# Patient Record
Sex: Female | Born: 1955
Health system: Southern US, Community
[De-identification: ages and names within clinical notes are randomized; demographics above are authoritative.]

## PROBLEM LIST (undated history)

## (undated) DIAGNOSIS — E079 Disorder of thyroid, unspecified: Secondary | ICD-10-CM

## (undated) DIAGNOSIS — I10 Essential (primary) hypertension: Secondary | ICD-10-CM

## (undated) HISTORY — PX: CHOLECYSTECTOMY: SHX55

## (undated) HISTORY — PX: APPENDECTOMY: SHX54

## (undated) HISTORY — PX: BREAST BIOPSY: SHX20

## (undated) HISTORY — PX: ABDOMINAL HYSTERECTOMY: SHX81

---

## 2000-10-24 ENCOUNTER — Observation Stay (HOSPITAL_COMMUNITY): Admission: RE | Admit: 2000-10-24 | Discharge: 2000-10-25 | Payer: Self-pay | Admitting: Internal Medicine

## 2001-03-04 ENCOUNTER — Encounter: Payer: Self-pay | Admitting: Internal Medicine

## 2001-03-04 ENCOUNTER — Ambulatory Visit (HOSPITAL_COMMUNITY): Admission: RE | Admit: 2001-03-04 | Discharge: 2001-03-04 | Payer: Self-pay | Admitting: Internal Medicine

## 2002-03-04 ENCOUNTER — Ambulatory Visit (HOSPITAL_COMMUNITY): Admission: RE | Admit: 2002-03-04 | Discharge: 2002-03-04 | Payer: Self-pay | Admitting: Internal Medicine

## 2002-03-04 ENCOUNTER — Encounter: Payer: Self-pay | Admitting: Internal Medicine

## 2003-03-07 ENCOUNTER — Ambulatory Visit (HOSPITAL_COMMUNITY): Admission: RE | Admit: 2003-03-07 | Discharge: 2003-03-07 | Payer: Self-pay | Admitting: Internal Medicine

## 2003-03-07 ENCOUNTER — Encounter: Payer: Self-pay | Admitting: Internal Medicine

## 2004-03-26 ENCOUNTER — Ambulatory Visit (HOSPITAL_COMMUNITY): Admission: RE | Admit: 2004-03-26 | Discharge: 2004-03-26 | Payer: Self-pay | Admitting: Internal Medicine

## 2005-03-28 ENCOUNTER — Ambulatory Visit (HOSPITAL_COMMUNITY): Admission: RE | Admit: 2005-03-28 | Discharge: 2005-03-28 | Payer: Self-pay | Admitting: Internal Medicine

## 2006-04-21 ENCOUNTER — Ambulatory Visit (HOSPITAL_COMMUNITY): Admission: RE | Admit: 2006-04-21 | Discharge: 2006-04-21 | Payer: Self-pay | Admitting: Family Medicine

## 2008-07-15 HISTORY — PX: COLONOSCOPY: SHX174

## 2008-07-26 ENCOUNTER — Ambulatory Visit (HOSPITAL_COMMUNITY): Admission: RE | Admit: 2008-07-26 | Discharge: 2008-07-26 | Payer: Self-pay | Admitting: Internal Medicine

## 2008-07-26 ENCOUNTER — Ambulatory Visit: Payer: Self-pay | Admitting: Internal Medicine

## 2008-07-26 ENCOUNTER — Encounter: Payer: Self-pay | Admitting: Internal Medicine

## 2009-03-01 ENCOUNTER — Ambulatory Visit (HOSPITAL_COMMUNITY): Admission: RE | Admit: 2009-03-01 | Discharge: 2009-03-01 | Payer: Self-pay | Admitting: Family Medicine

## 2010-11-27 NOTE — Op Note (Signed)
[name], Wendy Estrada             ACCOUNT NO.:  [id number]   MEDICAL RECORD NO.:  [id number]          PATIENT TYPE:  AMB   LOCATION:  DAY                           FACILITY:  [facility]   PHYSICIAN:  R. Roetta Sessions, M.D. DATE OF BIRTH:  09/22/1955   DATE OF PROCEDURE:  07/26/2008  DATE OF DISCHARGE:                               OPERATIVE REPORT   PROCEDURE:  Ileocolonoscopy and snare polypectomy.   INDICATIONS FOR PROCEDURE:  A 55 year old lady sent over at the courtesy  of [facility] for colorectal cancer screening, via  colonoscopy.  She has never had a lower GI tract imaged.  There is no  family history of polyps or colon cancer, and she is devoid of any lower  GI tract symptoms.  Colonoscopy is now being done.  Risks, benefits,  alternatives, and limitations have been reviewed, questions answered.  Please see the documentation in the medical record.   PROCEDURE NOTE:  O2 saturation, blood pressure, pulse, and respirations  were monitored throughout the entire procedure.   CONSCIOUS SEDATION:  Versed 2 mg IV and Demerol 50 mg IV.   INSTRUMENT:  Pentax video chip system.   FINDINGS:  Digital rectal exam revealed no abnormalities.  Endoscopic  findings:  The prep was good.  Colon:  Colonic mucosa was surveyed from  the rectosigmoid junction through the left transverse, right colon, to  the appendiceal orifice, ileocecal valve, and cecum.  These structures  were well seen and photographed for the record.  Terminal ileum was  intubated at 5 cm.  From this level, the scope was slowly and cautiously  withdrawn.  All previously mentioned mucosal surfaces were again seen.  The colonic mucosa as well as terminal ileum mucosa appeared normal.  The scope was pulled down the rectum where thorough examination of the  rectal mucosa including retroflexed view of the anal verge demonstrated  a 4-mm polyp and it was 10 cm.  It was cold snared and recovered.  The  remainder of  the rectal mucosa appeared normal.  The patient tolerated  the procedure well and was reacted to Endoscopy.   IMPRESSION:  1. Rectal polyp status post cold snare removal, otherwise unremarkable      rectal mucosa.  2. Normal colon and terminal ileum.   RECOMMENDATIONS:  1. Followup on path.  2. Further recommendations to follow.      Jonathon Bellows, M.D.  Electronically Signed     RMR/MEDQ  D:  07/26/2008  T:  07/26/2008  Job:  [id number]   cc:   Patrica Duel, M.D.  Fax: [phone]

## 2011-01-17 ENCOUNTER — Other Ambulatory Visit (HOSPITAL_COMMUNITY): Payer: Self-pay | Admitting: Family Medicine

## 2011-01-17 DIAGNOSIS — Z139 Encounter for screening, unspecified: Secondary | ICD-10-CM

## 2011-01-18 ENCOUNTER — Ambulatory Visit (HOSPITAL_COMMUNITY)
Admission: RE | Admit: 2011-01-18 | Discharge: 2011-01-18 | Disposition: A | Discharge status: Home / Self Care | Payer: PRIVATE HEALTH INSURANCE | Source: Ambulatory Visit | Attending: Family Medicine | Admitting: Family Medicine

## 2011-01-18 DIAGNOSIS — Z139 Encounter for screening, unspecified: Secondary | ICD-10-CM

## 2011-01-18 DIAGNOSIS — Z1231 Encounter for screening mammogram for malignant neoplasm of breast: Secondary | ICD-10-CM | POA: Insufficient documentation

## 2013-03-13 ENCOUNTER — Emergency Department (HOSPITAL_COMMUNITY): Payer: PRIVATE HEALTH INSURANCE

## 2013-03-13 ENCOUNTER — Encounter (HOSPITAL_COMMUNITY): Payer: Self-pay | Admitting: *Deleted

## 2013-03-13 ENCOUNTER — Emergency Department (HOSPITAL_COMMUNITY)
Admission: EM | Admit: 2013-03-13 | Discharge: 2013-03-13 | Disposition: A | Discharge status: Home / Self Care | Payer: PRIVATE HEALTH INSURANCE | Attending: Emergency Medicine | Admitting: Emergency Medicine

## 2013-03-13 DIAGNOSIS — R0602 Shortness of breath: Secondary | ICD-10-CM | POA: Insufficient documentation

## 2013-03-13 DIAGNOSIS — I10 Essential (primary) hypertension: Secondary | ICD-10-CM | POA: Insufficient documentation

## 2013-03-13 DIAGNOSIS — M2550 Pain in unspecified joint: Secondary | ICD-10-CM | POA: Insufficient documentation

## 2013-03-13 DIAGNOSIS — E876 Hypokalemia: Secondary | ICD-10-CM | POA: Insufficient documentation

## 2013-03-13 DIAGNOSIS — R079 Chest pain, unspecified: Secondary | ICD-10-CM

## 2013-03-13 DIAGNOSIS — R072 Precordial pain: Secondary | ICD-10-CM | POA: Insufficient documentation

## 2013-03-13 DIAGNOSIS — Z87891 Personal history of nicotine dependence: Secondary | ICD-10-CM | POA: Insufficient documentation

## 2013-03-13 DIAGNOSIS — Z8639 Personal history of other endocrine, nutritional and metabolic disease: Secondary | ICD-10-CM | POA: Insufficient documentation

## 2013-03-13 DIAGNOSIS — M2540 Effusion, unspecified joint: Secondary | ICD-10-CM | POA: Insufficient documentation

## 2013-03-13 DIAGNOSIS — Z862 Personal history of diseases of the blood and blood-forming organs and certain disorders involving the immune mechanism: Secondary | ICD-10-CM | POA: Insufficient documentation

## 2013-03-13 HISTORY — DX: Disorder of thyroid, unspecified: E07.9

## 2013-03-13 HISTORY — DX: Essential (primary) hypertension: I10

## 2013-03-13 LAB — CBC WITH DIFFERENTIAL/PLATELET
Basophils Absolute: 0 10*3/uL (ref 0.0–0.1)
Basophils Relative: 1 % (ref 0–1)
Eosinophils Absolute: 0.1 10*3/uL (ref 0.0–0.7)
Eosinophils Relative: 2 % (ref 0–5)
HCT: 39.8 % (ref 36.0–46.0)
Hemoglobin: 13.5 g/dL (ref 12.0–15.0)
Lymphocytes Relative: 51 % — ABNORMAL HIGH (ref 12–46)
Lymphs Abs: 1.7 10*3/uL (ref 0.7–4.0)
MCH: 32.1 pg (ref 26.0–34.0)
MCHC: 33.9 g/dL (ref 30.0–36.0)
MCV: 94.5 fL (ref 78.0–100.0)
Monocytes Absolute: 0.3 10*3/uL (ref 0.1–1.0)
Monocytes Relative: 8 % (ref 3–12)
Neutro Abs: 1.3 10*3/uL — ABNORMAL LOW (ref 1.7–7.7)
Neutrophils Relative %: 39 % — ABNORMAL LOW (ref 43–77)
Platelets: 218 10*3/uL (ref 150–400)
RBC: 4.21 MIL/uL (ref 3.87–5.11)
RDW: 13.5 % (ref 11.5–15.5)
WBC: 3.4 10*3/uL — ABNORMAL LOW (ref 4.0–10.5)

## 2013-03-13 LAB — BASIC METABOLIC PANEL
BUN: 15 mg/dL (ref 6–23)
CO2: 33 mEq/L — ABNORMAL HIGH (ref 19–32)
Calcium: 9.8 mg/dL (ref 8.4–10.5)
Chloride: 99 mEq/L (ref 96–112)
Creatinine, Ser: 0.73 mg/dL (ref 0.50–1.10)
GFR calc Af Amer: >90 mL/min (ref >90)
GFR calc non Af Amer: >90 mL/min (ref >90)
Glucose, Bld: 104 mg/dL — ABNORMAL HIGH (ref 70–99)
Potassium: 3.1 mEq/L — ABNORMAL LOW (ref 3.5–5.1)
Sodium: 139 mEq/L (ref 135–145)

## 2013-03-13 LAB — TROPONIN I
Troponin I: <0.3 ng/mL (ref <0.30)
Troponin I: <0.3 ng/mL (ref <0.30)

## 2013-03-13 LAB — D-DIMER, QUANTITATIVE: D-Dimer, Quant: 1.9 ug/mL-FEU — ABNORMAL HIGH (ref 0.00–0.48)

## 2013-03-13 MED ORDER — POTASSIUM CHLORIDE CRYS ER 20 MEQ PO TBCR
40.0000 meq | EXTENDED_RELEASE_TABLET | Freq: Once | ORAL | Status: AC
  Administered 2013-03-13: 40 meq via ORAL
  Filled 2013-03-13: qty 2

## 2013-03-13 MED ORDER — POTASSIUM CHLORIDE ER 20 MEQ PO TBCR: 20.0000 meq | EXTENDED_RELEASE_TABLET | Freq: Two times a day (BID) | ORAL | Status: DC

## 2013-03-13 MED ORDER — IOHEXOL 350 MG/ML SOLN
100.0000 mL | Freq: Once | INTRAVENOUS | Status: AC | PRN
  Administered 2013-03-13: 100 mL via INTRAVENOUS

## 2013-03-13 NOTE — ED Provider Notes (Signed)
CSN: [id number]     Arrival date & time 03/13/13  4098 History   First MD Initiated Contact with Patient 03/13/13 [phone]     Chief Complaint  Patient presents with  . Chest Pain   (Consider location/radiation/quality/duration/timing/severity/associated sxs/prior Treatment) HPI Comments: Wendy Estrada is a 57 y.o. female who presents to the Emergency Department complaining of upper chest pressure and heaviness for 2 weeks.  States the pain began after standing near her son who was mowing the lawn and the grass clippings were blown in her face.  She describes a "heaviness" to her chest that has improved since onset, but states it has not resolved.  Also states she had one episode of shortness of breath, but denies any at this time.  She states that she was seen by her PMD this morning and was sent to ED for further evaluation.  She also denies nausea, fever, vomiting, abdominal pain, dizziness or headache.    Patient is a 57 y.o. female presenting with chest pain. The history is provided by the patient.  Chest Pain Pain location:  Substernal area Pain quality: pressure and tightness   Pain radiates to:  Does not radiate Pain radiates to the back: no   Pain severity:  Mild Onset quality:  Sudden Duration:  2 weeks Timing:  Constant Progression:  Partially resolved Chronicity:  New Context: not breathing, no drug use, not eating, not lifting, no movement, not raising an arm, not at rest, no stress and no trauma   Relieved by:  Nothing Worsened by:  Nothing tried Ineffective treatments:  Certain positions (OTC zyrtec) Associated symptoms: shortness of breath   Associated symptoms: no abdominal pain, no altered mental status, no anxiety, no back pain, no cough, no dizziness, no dysphagia, no fatigue, no fever, no headache, no nausea, no near-syncope, no numbness, no orthopnea, no palpitations, no syncope, not vomiting and no weakness   Risk factors: hypertension and obesity   Risk factors:  no aortic disease, no birth control, no coronary artery disease, no diabetes mellitus, no immobilization and no prior DVT/PE   Risk factors comment:  Former smoker   Past Medical History  Diagnosis Date  . Hypertension   . Thyroid disease     hypothyroidism   Past Surgical History  Procedure Laterality Date  . Cholecystectomy    . Appendectomy    . Abdominal hysterectomy     History reviewed. No pertinent family history. History  Substance Use Topics  . Smoking status: Former Games developer  . Smokeless tobacco: Not on file  . Alcohol Use: No   OB History   Grav Para Term Preterm Abortions TAB SAB Ect Mult Living                 Review of Systems  Constitutional: Negative for fever, chills, activity change, appetite change and fatigue.  HENT: Negative for trouble swallowing, neck pain and neck stiffness.   Respiratory: Positive for chest tightness and shortness of breath. Negative for apnea, cough, choking, wheezing and stridor.   Cardiovascular: Positive for chest pain. Negative for palpitations, orthopnea, leg swelling, syncope and near-syncope.  Gastrointestinal: Negative for nausea, vomiting and abdominal pain.  Genitourinary: Negative for dysuria, flank pain and difficulty urinating.  Musculoskeletal: Positive for joint swelling and arthralgias. Negative for back pain.  Skin: Negative for color change, rash and wound.  Neurological: Negative for dizziness, syncope, speech difficulty, weakness, numbness and headaches.  Hematological: Negative for adenopathy. Does not bruise/bleed easily.  All other systems  reviewed and are negative.    Allergies  Review of patient's allergies indicates not on file.  Home Medications  No current outpatient prescriptions on file. BP 151/84  Pulse 62  Temp(Src) 98.1 F (36.7 C) (Oral)  Resp 17  Ht 5\' 5"  (1.651 m)  Wt 151 lb (68.493 kg)  BMI 25.13 kg/m2  SpO2 100% Physical Exam  Nursing note and vitals reviewed. Constitutional: She  is oriented to person, place, and time. She appears well-developed and well-nourished. No distress.  HENT:  Head: Normocephalic and atraumatic.  Mouth/Throat: Oropharynx is clear and moist.  Neck: Normal range of motion. Neck supple.  Cardiovascular: Normal rate, regular rhythm, normal heart sounds and intact distal pulses.   No murmur heard. Pulmonary/Chest: Effort normal and breath sounds normal. No respiratory distress. She exhibits no tenderness.  Abdominal: Soft. She exhibits no distension. There is no tenderness. There is no rebound and no guarding.  Musculoskeletal: Normal range of motion. She exhibits no edema.  Lymphadenopathy:    She has no cervical adenopathy.  Neurological: She is alert and oriented to person, place, and time. She exhibits normal muscle tone. Coordination normal.  Skin: Skin is warm and dry.    ED Course  Procedures (including critical care time)   Labs Review  Results for orders placed during the hospital encounter of 03/13/13  CBC WITH DIFFERENTIAL      Result Value Range   WBC 3.4 (*) 4.0 - 10.5 K/uL   RBC 4.21  3.87 - 5.11 MIL/uL   Hemoglobin 13.5  12.0 - 15.0 g/dL   HCT 16.1  09.6 - 04.5 %   MCV 94.5  78.0 - 100.0 fL   MCH 32.1  26.0 - 34.0 pg   MCHC 33.9  30.0 - 36.0 g/dL   RDW 40.9  81.1 - 91.4 %   Platelets 218  150 - 400 K/uL   Neutrophils Relative % 39 (*) 43 - 77 %   Neutro Abs 1.3 (*) 1.7 - 7.7 K/uL   Lymphocytes Relative 51 (*) 12 - 46 %   Lymphs Abs 1.7  0.7 - 4.0 K/uL   Monocytes Relative 8  3 - 12 %   Monocytes Absolute 0.3  0.1 - 1.0 K/uL   Eosinophils Relative 2  0 - 5 %   Eosinophils Absolute 0.1  0.0 - 0.7 K/uL   Basophils Relative 1  0 - 1 %   Basophils Absolute 0.0  0.0 - 0.1 K/uL  BASIC METABOLIC PANEL      Result Value Range   Sodium 139  135 - 145 mEq/L   Potassium 3.1 (*) 3.5 - 5.1 mEq/L   Chloride 99  96 - 112 mEq/L   CO2 33 (*) 19 - 32 mEq/L   Glucose, Bld 104 (*) 70 - 99 mg/dL   BUN 15  6 - 23 mg/dL    Creatinine, Ser 7.82  0.50 - 1.10 mg/dL   Calcium 9.8  8.4 - 95.6 mg/dL   GFR calc non Af Amer >90  >90 mL/min   GFR calc Af Amer >90  >90 mL/min  TROPONIN I      Result Value Range   Troponin I <0.30  <0.30 ng/mL  D-DIMER, QUANTITATIVE      Result Value Range   D-Dimer, Quant 1.90 (*) 0.00 - 0.48 ug/mL-FEU  TROPONIN I      Result Value Range   Troponin I <0.30  <0.30 ng/mL     Imaging Review  Dg Chest  2 View  03/13/2013   *RADIOLOGY REPORT*  Clinical Data: Chest pain  CHEST - 2 VIEW  Comparison: None.  Findings: The heart pulmonary vascularity are within normal limits. The lungs are clear bilaterally.  No acute bony abnormality is seen.  IMPRESSION: No acute abnormality noted.   Original Report Authenticated By: Alcide Clever, M.D.   Ct Angio Chest W/cm &/or Wo Cm  03/13/2013   *RADIOLOGY REPORT*  Clinical Data: Chest pain  CT ANGIOGRAPHY CHEST  Technique:  Multidetector CT imaging of the chest using the standard protocol during bolus administration of intravenous contrast. Multiplanar reconstructed images including MIPs were obtained and reviewed to evaluate the vascular anatomy.  Contrast: OMNIPAQUE IOHEXOL 350 MG/ML SOLN  Comparison: None.  Findings: The lungs are clear bilaterally.  No focal infiltrate or sizable effusion is seen.  The hilar and mediastinal structures are within normal limits.  No lymphadenopathy is seen.  The thoracic aorta is unremarkable.  The pulmonary artery is well visualized and shows no filling defect to suggest pulmonary emboli.  The visualized upper abdomen is within normal limits.  No bony abnormality is identified.  IMPRESSION: No evidence of pulmonary emboli.  No acute abnormality is noted.   Original Report Authenticated By: Alcide Clever, M.D.    MDM     Date: 03/13/2013  Rate: 60  Rhythm: normal sinus rhythm  QRS Axis: normal  Intervals: normal  ST/T Wave abnormalities: normal  Conduction Disutrbances:none  Narrative Interpretation:   Old EKG  Reviewed: none available  EKG reviewed by Dr. Clelia Schaumann  Advised that patient's PMD, Dr. Sherwood Gambler, sent pt here for cardiac work-up and he will then arrange pt to have stress test if cleared for discharge.  1120  Patient is resting comfortably.  Denies pain or dyspnea.  Discussed x-ray and lab results.  D-dimer elevated, Will order CT angio.  Pt hx and care plan discussed with the EDP, Dr. Estell Harpin who agrees to care plan  Labs and imaging results discussed with the patient, continues to deny pain or shortness of breath.  Mild hypokalemia that is likely related to her HCTZ use, will give potassium here and prescribe short course.  Patient agrees to f/u with her PMD to have potassium rechecked, VSS.  Appears stable for discharge.     Paola Flynt L. Niambi Smoak, PA-C 03/15/13 0930

## 2013-03-13 NOTE — ED Notes (Signed)
Pt c/o chest heaviness and intermittent dizziness x2-3 weeks. Pt had one episode of SOB since symptoms began. Pt denies NVD. Pt also reports intermittent headache, "scratchy throat" and nasal congestion.

## 2013-03-13 NOTE — ED Notes (Addendum)
Patient states chest heaviness began about 2-3 weeks ago after having grass shavings blown in her face.  It has not worsened over period of time.  Had period of SOB initially which has resolved. No fevers, cough, or nausea.  Slight throat soreness intermittently.  Saw Dr. Sherwood Gambler today who wanted her scheduled for a stress test.

## 2013-03-15 NOTE — ED Provider Notes (Signed)
Medical screening examination/treatment/procedure(s) were performed by non-physician practitioner and as supervising physician I was immediately available for consultation/collaboration.   Sampson Self L Carolin Quang, MD 03/15/13 2156 

## 2013-03-22 ENCOUNTER — Other Ambulatory Visit (HOSPITAL_COMMUNITY): Payer: Self-pay | Admitting: Internal Medicine

## 2013-03-22 DIAGNOSIS — Z139 Encounter for screening, unspecified: Secondary | ICD-10-CM

## 2013-03-23 ENCOUNTER — Ambulatory Visit (HOSPITAL_COMMUNITY)
Admission: RE | Admit: 2013-03-23 | Discharge: 2013-03-23 | Disposition: A | Discharge status: Home / Self Care | Payer: PRIVATE HEALTH INSURANCE | Source: Ambulatory Visit | Attending: Internal Medicine | Admitting: Internal Medicine

## 2013-03-23 DIAGNOSIS — Z1231 Encounter for screening mammogram for malignant neoplasm of breast: Secondary | ICD-10-CM | POA: Insufficient documentation

## 2013-03-23 DIAGNOSIS — Z139 Encounter for screening, unspecified: Secondary | ICD-10-CM

## 2013-03-25 ENCOUNTER — Ambulatory Visit (HOSPITAL_COMMUNITY): Payer: PRIVATE HEALTH INSURANCE

## 2013-03-26 ENCOUNTER — Other Ambulatory Visit: Payer: Self-pay | Admitting: Internal Medicine

## 2013-03-26 DIAGNOSIS — R928 Other abnormal and inconclusive findings on diagnostic imaging of breast: Secondary | ICD-10-CM

## 2013-03-31 ENCOUNTER — Ambulatory Visit (HOSPITAL_COMMUNITY)
Admission: RE | Admit: 2013-03-31 | Discharge: 2013-03-31 | Disposition: A | Discharge status: Home / Self Care | Payer: PRIVATE HEALTH INSURANCE | Source: Ambulatory Visit | Attending: Internal Medicine | Admitting: Internal Medicine

## 2013-03-31 ENCOUNTER — Other Ambulatory Visit: Payer: Self-pay | Admitting: Internal Medicine

## 2013-03-31 DIAGNOSIS — R928 Other abnormal and inconclusive findings on diagnostic imaging of breast: Secondary | ICD-10-CM

## 2013-04-01 ENCOUNTER — Encounter: Payer: Self-pay | Admitting: Cardiology

## 2013-04-01 ENCOUNTER — Ambulatory Visit (INDEPENDENT_AMBULATORY_CARE_PROVIDER_SITE_OTHER): Payer: PRIVATE HEALTH INSURANCE | Admitting: Cardiology

## 2013-04-01 VITALS — BP 152/80 | HR 54 | Ht 65.0 in | Wt 153.0 lb

## 2013-04-01 DIAGNOSIS — Z720 Tobacco use: Secondary | ICD-10-CM | POA: Insufficient documentation

## 2013-04-01 DIAGNOSIS — E785 Hyperlipidemia, unspecified: Secondary | ICD-10-CM

## 2013-04-01 DIAGNOSIS — F17200 Nicotine dependence, unspecified, uncomplicated: Secondary | ICD-10-CM

## 2013-04-01 DIAGNOSIS — I10 Essential (primary) hypertension: Secondary | ICD-10-CM | POA: Insufficient documentation

## 2013-04-01 DIAGNOSIS — R079 Chest pain, unspecified: Secondary | ICD-10-CM | POA: Insufficient documentation

## 2013-04-01 MED ORDER — PANTOPRAZOLE SODIUM 20 MG PO TBEC: 20.0000 mg | DELAYED_RELEASE_TABLET | Freq: Every day | ORAL | Status: DC

## 2013-04-01 NOTE — Patient Instructions (Addendum)
Your physician recommends that you schedule a follow-up appointment in: 3 WEEKS  Your physician has requested that you have an exercise tolerance test. For further information please visit [email or web address] Please also follow instruction sheet, as given.  Your physician has requested that you have an echocardiogram. Echocardiography is a painless test that uses sound waves to create images of your heart. It provides your doctor with information about the size and shape of your heart and how well your heart's chambers and valves are working. This procedure takes approximately one hour. There are no restrictions for this procedure.  Your physician has recommended you make the following change in your medication:   1) START TAKING PANTOPROZOLE 20MG  ONCE DAILY  Exercise Stress Electrocardiography An exercise stress test is a heart test (EKG) which is done while you are moving. You will walk on a treadmill. This test will tell your doctor how your heart does when it is forced to work harder and how much activity you can safely handle. BEFORE THE TEST  Wear shorts or athletic pants.  Wear comfortable tennis shoes.  Women need to wear a bra that allows patches to be put on under it. TEST  An EKG cable will be attached to your waist. This cable is hooked up to patches, which look like round stickers stuck to your chest.  You will be asked to walk on the treadmill.  You will walk until you are too tired or until you are told to stop.  Tell the doctor right away if you have:  Chest pain.  Leg cramps.  Shortness of breath.  Dizziness.  The test may last 30 minutes to 1 hour. The timing depends on your physical condition and the condition of your heart. AFTER THE TEST  You will rest for about 6 minutes. During this time, your heart rhythm and blood pressure will be checked.  The testing equipment will be removed from your body and you can get dressed.  You may go home or back to  your hospital room. You may keep doing all your usual activities as told by your doctor. Finding out the results of your test Ask when your test results will be ready. Make sure you get your test results. Document Released: 12/18/2007 Document Revised: 09/23/2011 Document Reviewed: 12/18/2007 [facility] Patient Information 2014 [place], [place].

## 2013-04-01 NOTE — Progress Notes (Signed)
   Clinical Summary Ms. Latella is a 57 y.o.female  1. Chest pain:  - started approx 1 month ago. Pressure like pain in mid chest, 3/10. Has belching with it with improvement of symptoms. No SOB, no palpitations, no diaphoresis, occas nausea, no vomiting. Often comes on w/ hunger, often better w/ eating. Sometimes comes on with emotional stress or exertion. Typically symptoms last just a few minutes. Not positional.  - highest level of exertion is pushing lawn mower for 2.5 hrs straight - no orthopnea, no PND, no LE edema - CAD risk factrors: HTN, HL, former tobacco, father MI in 38s.  -seen in ER 03/13/13 - +D-dimer, negatve CT PE, Trop negative.   2. HTN - checks at home occasionally. Last PCP visit 120/78.  3. MV:HQIONGEX by PCP, compliant w/ statin  Past Medical History  Diagnosis Date  . Hypertension   . Thyroid disease     hypothyroidism     Allergies  Allergen Reactions  . Codeine     nausea     Current Outpatient Prescriptions  Medication Sig Dispense Refill  . cetirizine (ZYRTEC) 10 MG tablet Take 10 mg by mouth daily.      . hydrochlorothiazide (HYDRODIURIL) 25 MG tablet Take 25 mg by mouth daily.      Marland Kitchen levothyroxine (SYNTHROID, LEVOTHROID) 25 MCG tablet Take 25 mcg by mouth daily before breakfast.      . potassium chloride 20 MEQ TBCR Take 20 mEq by mouth 2 (two) times daily.  30 tablet  0  . simvastatin (ZOCOR) 20 MG tablet Take 20 mg by mouth every evening.       No current facility-administered medications for this visit.     Past Surgical History  Procedure Laterality Date  . Cholecystectomy    . Appendectomy    . Abdominal hysterectomy       Allergies  Allergen Reactions  . Codeine     nausea      No family history on file.   Social History Ms. Storr reports that she has quit smoking. She does not have any smokeless tobacco history on file. Ms. Korte reports that she does not drink alcohol.   Review of Systems 12 point ROS  negative other than reported in HPI  Physical Examination p 54 bp 152/80 Wt 153 BMI 25  Gen: resting comfortably, NAD HEENT: no scleral icterus, pupils equal round and reactive, no palptable cervical adenopathy CV Pulm: CTAB Abd: soft, NT, ND NABS, no hepatosplenomegaly Ext: warm, no edema.  Skin: warm, no rash Neuro: A&Ox3, no focal deficits    Diagnostic Studies 03/13/13 CTA: no PE, no acute abnormality  03/13/13: SR, rate 65, normal axis, PR 160, QRS .80 LAE, no ischemic changes  Assessment and Plan  1. Chest pain: story mainly atypical for cardiac chest pain, most consistent likely w/ GI etiology. Some components are worrisome given that there is some association w/ exertion, and she has several CAD risk factors - will obtain exercise stress test - obtain 2D echocardiogram - start PPI emperically  2. HTN: elevated here, at PCP visit was at goal - continued management per PCP  3. HL: no recent panel available -continued management per PCP  Antoine Poche, M.D., F.A.C.C.

## 2013-04-02 ENCOUNTER — Encounter: Payer: Self-pay | Admitting: Cardiology

## 2013-04-08 ENCOUNTER — Encounter (HOSPITAL_COMMUNITY): Payer: PRIVATE HEALTH INSURANCE

## 2013-04-09 ENCOUNTER — Encounter (HOSPITAL_COMMUNITY): Payer: Self-pay

## 2013-04-09 ENCOUNTER — Encounter (HOSPITAL_COMMUNITY): Payer: PRIVATE HEALTH INSURANCE

## 2013-04-09 ENCOUNTER — Ambulatory Visit (HOSPITAL_COMMUNITY)
Admission: RE | Admit: 2013-04-09 | Discharge: 2013-04-09 | Disposition: A | Discharge status: Home / Self Care | Payer: PRIVATE HEALTH INSURANCE | Source: Ambulatory Visit | Attending: Internal Medicine | Admitting: Internal Medicine

## 2013-04-09 DIAGNOSIS — R079 Chest pain, unspecified: Secondary | ICD-10-CM

## 2013-04-09 DIAGNOSIS — Z87891 Personal history of nicotine dependence: Secondary | ICD-10-CM | POA: Insufficient documentation

## 2013-04-09 DIAGNOSIS — I10 Essential (primary) hypertension: Secondary | ICD-10-CM | POA: Insufficient documentation

## 2013-04-09 DIAGNOSIS — I059 Rheumatic mitral valve disease, unspecified: Secondary | ICD-10-CM

## 2013-04-09 NOTE — Progress Notes (Signed)
*  PRELIMINARY RESULTS* Echocardiogram 2D Echocardiogram has been performed.  Wendy Estrada 04/09/2013, 1:03 PM

## 2013-04-09 NOTE — Progress Notes (Signed)
Stress Lab Nurses Notes - [name]  Wendy Estrada 04/09/2013 Reason for doing test: Chest Pain Type of test: Regular GTX Nurse performing test: Parke Poisson, RN Nuclear Medicine Tech: Not Applicable Echo Tech: Not Applicable MD performing test: Dr. Purvis Sheffield / Joni Reining NP Family MD: Dr. Sherwood Gambler Test explained and consent signed: yes IV started: No IV started Symptoms: None Treatment/Intervention: None Reason test stopped: reached target HR After recovery IV was: NA Patient to return to Nuc. Med at : NA Patient discharged: Home Patient's Condition upon discharge was: stable Comments: During test peak BP 196/68 & HR 179.  Recovery BP 138/88 & HR 74.  Symptoms resolved in recovery. [name]

## 2013-04-23 ENCOUNTER — Ambulatory Visit (INDEPENDENT_AMBULATORY_CARE_PROVIDER_SITE_OTHER): Payer: PRIVATE HEALTH INSURANCE | Admitting: Cardiology

## 2013-04-23 ENCOUNTER — Encounter: Payer: Self-pay | Admitting: Cardiology

## 2013-04-23 VITALS — BP 142/78 | HR 58 | Ht 65.0 in | Wt 156.0 lb

## 2013-04-23 DIAGNOSIS — R079 Chest pain, unspecified: Secondary | ICD-10-CM

## 2013-04-23 NOTE — Progress Notes (Signed)
Clinical Summary Ms. Gaster is a 57 y.o.female 1. Chest pain:  - started approx 1 month ago. Pressure like pain in mid chest, 3/10. Has belching with it with improvement of symptoms. No SOB, no palpitations, no diaphoresis, occas nausea, no vomiting. Often comes on w/ hunger, often better w/ eating. Sometimes comes on with emotional stress or exertion. Typically symptoms last just a few minutes. Not positional.  - highest level of exertion is pushing lawn mower for 2.5 hrs straight  - no orthopnea, no PND, no LE edema  - CAD risk factrors: HTN, HL, former tobacco, father MI in 73s.  -seen in ER 03/13/13  - +D-dimer, negatve CT PE, Trop negative.   - started PPI emperically last visit, symptoms have now resolved.     Past Medical History  Diagnosis Date  . Hypertension   . Thyroid disease     hypothyroidism     Allergies  Allergen Reactions  . Codeine     nausea     Current Outpatient Prescriptions  Medication Sig Dispense Refill  . cetirizine (ZYRTEC) 10 MG tablet Take 10 mg by mouth daily.      . hydrochlorothiazide (HYDRODIURIL) 25 MG tablet Take 25 mg by mouth daily.      Marland Kitchen levothyroxine (SYNTHROID, LEVOTHROID) 25 MCG tablet Take 25 mcg by mouth daily before breakfast.      . pantoprazole (PROTONIX) 20 MG tablet Take 1 tablet (20 mg total) by mouth daily.  90 tablet  1  . potassium chloride 20 MEQ TBCR Take 20 mEq by mouth 2 (two) times daily.  30 tablet  0  . simvastatin (ZOCOR) 20 MG tablet Take 20 mg by mouth every evening.       No current facility-administered medications for this visit.     Past Surgical History  Procedure Laterality Date  . Cholecystectomy    . Appendectomy    . Abdominal hysterectomy       Allergies  Allergen Reactions  . Codeine     nausea      No family history on file.   Social History Ms. Derk reports that she has quit smoking. She does not have any smokeless tobacco history on file. Ms. Villasenor reports that  she does not drink alcohol.   Review of Systems CONSTITUTIONAL: No weight loss, fever, chills, weakness or fatigue.  HEENT: Eyes: No visual loss, blurred vision, double vision or yellow sclerae.No hearing loss, sneezing, congestion, runny nose or sore throat.  SKIN: No rash or itching.  CARDIOVASCULAR: per HPI RESPIRATORY: No shortness of breath, cough or sputum.  GASTROINTESTINAL: No anorexia, nausea, vomiting or diarrhea. No abdominal pain or blood.  GENITOURINARY: No burning on urination, no polyuria NEUROLOGICAL: No headache, dizziness, syncope, paralysis, ataxia, numbness or tingling in the extremities. No change in bowel or bladder control.  MUSCULOSKELETAL: No muscle, back pain, joint pain or stiffness.  LYMPHATICS: No enlarged nodes. No history of splenectomy.  PSYCHIATRIC: No history of depression or anxiety.  ENDOCRINOLOGIC: No reports of sweating, cold or heat intolerance. No polyuria or polydipsia.  Marland Kitchen   Physical Examination p 58 bp 142/78 Wt 156 lbs BMI 26 Gen: resting comfortably, no acute distress HEENT: no scleral icterus, pupils equal round and reactive, no palptable cervical adenopathy,  CV: RRR, no m/r/g, no JVD Resp: Clear to auscultation bilaterally GI: abdomen is soft, non-tender, non-distended, normal bowel sounds, no hepatosplenomegaly MSK: extremities are warm, no edema.  Skin: warm, no rash Neuro:  no focal  deficits Psych: appropriate affect   Diagnostic Studies 04/09/13 Echo: LVEF 60-65%, normal diastolic function,   Exc stress test 04/09/13:   8 minutes, METs 10.40, TH 107%, above average functional capacity, no chest pain. Non-specific ST/T changes inferior leads, less than 1 mm ST depression lateral precordial leads during recovery. Duke Treadmill score 8, low risk  Assessment and Plan   1. Chest pain: story mainly atypical for cardiac chest pain, most consistent likely w/ GI etiology. Some components are worrisome given that there is some  association w/ exertion, and she has several CAD risk factors  - exercise stress test low risk, showed good functional capacity, low risk Duke score with non-specific ST/T changes overall not specific for ischemia - symptoms have resolved on PPI - obtain 2D echocardiogram  - start PPI emperically  - will discharge from clinic, feel free to reconsult if neccesary in the future.        Antoine Poche, M.D., F.A.C.C.

## 2013-04-23 NOTE — Patient Instructions (Addendum)
Your physician recommends that you schedule a follow-up appointment in: AS NEEDED  

## 2013-05-14 ENCOUNTER — Encounter: Payer: Self-pay | Admitting: Cardiovascular Disease

## 2013-05-14 NOTE — Progress Notes (Signed)
Stress Lab Nurses Notes - [name]  Wendy Estrada  04/09/2013  Reason for doing test: Chest Pain  Type of test: Regular GTX  Nurse performing test: Parke Poisson, RN  Nuclear Medicine Tech: Not Applicable  Echo Tech: Not Applicable  MD performing test: Dr. Purvis Sheffield / Joni Reining NP  Family MD: Dr. Sherwood Gambler  Test explained and consent signed: yes  IV started: No IV started  Symptoms: None  Treatment/Intervention: None  Reason test stopped: reached target HR  After recovery IV was: NA  Patient to return to Nuc. Med at : NA  Patient discharged: Home  Patient's Condition upon discharge was: stable  Comments: During test peak BP 196/68 & HR 179. Recovery BP 138/88 & HR 74. Symptoms resolved in recovery.  [name]   Attending Note: The patient was stressed according to the Bruce protocol for 8:00, achieving a work level of 10.4 METS. The resting HR of 57 bpm rose to a maximal HR of 179 bpm, representing 107% of the maximal, age-predicted HR. The resting BP of 152/82 mmHg rose to a maximal BP of 202/82 mmHg. The test was stopped due to fatigue.  The resting ECG showed normal sinus rhythm, 62 bpm. With exercise, there were <1 mm horizontal ST segment depressions, which are non-diagnostic. There were no arrhythmias seen.  He had good exercise tolerance. He was noted to have resting stage 1 hypertension with a hypertensive response to exercise. His Duke treadmill score is 5.5, placing him at a low risk for future cardiovascular events.

## 2014-01-17 ENCOUNTER — Telehealth: Payer: Self-pay | Admitting: Cardiology

## 2014-01-17 NOTE — Telephone Encounter (Signed)
Received fax refill request ° °Rx # [id number] °Medication:  Pantoprazole 20 mg tablets °Qty 90 °Sig:  Take one tablet by mouth every day °Physician:  Branch  ° ° °

## 2014-01-17 NOTE — Telephone Encounter (Signed)
Pt is prn only now to cardiology since 04/2013 Will refer to pcp to refill

## 2014-01-18 ENCOUNTER — Telehealth: Payer: Self-pay | Admitting: Cardiology

## 2014-01-18 MED ORDER — PANTOPRAZOLE SODIUM 20 MG PO TBEC: 20.0000 mg | DELAYED_RELEASE_TABLET | Freq: Every day | ORAL | Status: DC

## 2014-01-18 NOTE — Telephone Encounter (Signed)
Refill request complete 

## 2014-01-18 NOTE — Telephone Encounter (Signed)
Received fax refill request  Rx # [id number] Medication:  Pantoprazole 20 mg tablets Qty 90 Sig:  Take one tablet by mouth every day Physician:  Wyline MoodBranch

## 2014-12-29 ENCOUNTER — Ambulatory Visit (INDEPENDENT_AMBULATORY_CARE_PROVIDER_SITE_OTHER): Payer: PRIVATE HEALTH INSURANCE | Admitting: Orthopedic Surgery

## 2014-12-29 VITALS — BP 158/92 | Ht 65.0 in | Wt 183.0 lb

## 2014-12-29 DIAGNOSIS — M722 Plantar fascial fibromatosis: Secondary | ICD-10-CM | POA: Diagnosis not present

## 2014-12-29 NOTE — Patient Instructions (Addendum)
Plantar Fasciitis (Heel Spur Syndrome) with Rehab The plantar fascia is a fibrous, ligament-like, soft-tissue structure that spans the bottom of the foot. Plantar fasciitis is a condition that causes pain in the foot due to inflammation of the tissue. SYMPTOMS   Pain and tenderness on the underneath side of the foot.  Pain that worsens with standing or walking. CAUSES  Plantar fasciitis is caused by irritation and injury to the plantar fascia on the underneath side of the foot. Common mechanisms of injury include:  Direct trauma to bottom of the foot.  Damage to a small nerve that runs under the foot where the main fascia attaches to the heel bone.  Stress placed on the plantar fascia due to bone spurs. RISK INCREASES WITH:   Activities that place stress on the plantar fascia (running, jumping, pivoting, or cutting).  Poor strength and flexibility.  Improperly fitted shoes.  Tight calf muscles.  Flat feet.  Failure to warm-up properly before activity.  Obesity. PREVENTION  Warm up and stretch properly before activity.  Allow for adequate recovery between workouts.  Maintain physical fitness:  Strength, flexibility, and endurance.  Cardiovascular fitness.  Maintain a health body weight.  Avoid stress on the plantar fascia.  Wear properly fitted shoes, including arch supports for individuals who have flat feet. PROGNOSIS  If treated properly, then the symptoms of plantar fasciitis usually resolve without surgery. However, occasionally surgery is necessary. RELATED COMPLICATIONS   Recurrent symptoms that may result in a chronic condition.  Problems of the lower back that are caused by compensating for the injury, such as limping.  Pain or weakness of the foot during push-off following surgery.  Chronic inflammation, scarring, and partial or complete fascia tear, occurring more often from repeated injections. TREATMENT  Treatment initially involves the use of  ice and medication to help reduce pain and inflammation. The use of strengthening and stretching exercises may help reduce pain with activity, especially stretches of the Achilles tendon. These exercises may be performed at home or with a therapist. Your caregiver may recommend that you use heel cups of arch supports to help reduce stress on the plantar fascia. Occasionally, corticosteroid injections are given to reduce inflammation. If symptoms persist for greater than 6 months despite non-surgical (conservative), then surgery may be recommended.  MEDICATION   If pain medication is necessary, then nonsteroidal anti-inflammatory medications, such as aspirin and ibuprofen, or other minor pain relievers, such as acetaminophen, are often recommended.  Do not take pain medication within 7 days before surgery.  Prescription pain relievers may be given if deemed necessary by your caregiver. Use only as directed and only as much as you need.  Corticosteroid injections may be given by your caregiver. These injections should be reserved for the most serious cases, because they may only be given a certain number of times. HEAT AND COLD  Cold treatment (icing) relieves pain and reduces inflammation. Cold treatment should be applied for 10 to 15 minutes every 2 to 3 hours for inflammation and pain and immediately after any activity that aggravates your symptoms. Use ice packs or massage the area with a piece of ice (ice massage).  Heat treatment may be used prior to performing the stretching and strengthening activities prescribed by your caregiver, physical therapist, or athletic trainer. Use a heat pack or soak the injury in warm water. SEEK IMMEDIATE MEDICAL CARE IF:  Treatment seems to offer no benefit, or the condition worsens.  Any medications produce adverse side effects. EXERCISES do  3 sets of 10 hold for 3-5 seconds RANGE OF MOTION (ROM) AND STRETCHING EXERCISES - Plantar Fasciitis (Heel Spur  Syndrome) These exercises may help you when beginning to rehabilitate your injury. Your symptoms may resolve with or without further involvement from your physician, physical therapist or athletic trainer. While completing these exercises, remember:   Restoring tissue flexibility helps normal motion to return to the joints. This allows healthier, less painful movement and activity.  An effective stretch should be held for at least 30 seconds.  A stretch should never be painful. You should only feel a gentle lengthening or release in the stretched tissue. RANGE OF MOTION - Toe Extension, Flexion  Sit with your right / left leg crossed over your opposite knee.  Grasp your toes and gently pull them back toward the top of your foot. You should feel a stretch on the bottom of your toes and/or foot.  Hold this stretch for __________ seconds.  Now, gently pull your toes toward the bottom of your foot. You should feel a stretch on the top of your toes and or foot.  Hold this stretch for __________ seconds. Repeat __________ times. Complete this stretch __________ times per day.  RANGE OF MOTION - Ankle Dorsiflexion, Active Assisted  Remove shoes and sit on a chair that is preferably not on a carpeted surface.  Place right / left foot under knee. Extend your opposite leg for support.  Keeping your heel down, slide your right / left foot back toward the chair until you feel a stretch at your ankle or calf. If you do not feel a stretch, slide your bottom forward to the edge of the chair, while still keeping your heel down.  Hold this stretch for __________ seconds. Repeat __________ times. Complete this stretch __________ times per day.  STRETCH - Gastroc, Standing  Place hands on wall.  Extend right / left leg, keeping the front knee somewhat bent.  Slightly point your toes inward on your back foot.  Keeping your right / left heel on the floor and your knee straight, shift your weight  toward the wall, not allowing your back to arch.  You should feel a gentle stretch in the right / left calf. Hold this position for __________ seconds. Repeat __________ times. Complete this stretch __________ times per day. STRETCH - Soleus, Standing  Place hands on wall.  Extend right / left leg, keeping the other knee somewhat bent.  Slightly point your toes inward on your back foot.  Keep your right / left heel on the floor, bend your back knee, and slightly shift your weight over the back leg so that you feel a gentle stretch deep in your back calf.  Hold this position for __________ seconds. Repeat __________ times. Complete this stretch __________ times per day. STRETCH - Gastrocsoleus, Standing  Note: This exercise can place a lot of stress on your foot and ankle. Please complete this exercise only if specifically instructed by your caregiver.   Place the ball of your right / left foot on a step, keeping your other foot firmly on the same step.  Hold on to the wall or a rail for balance.  Slowly lift your other foot, allowing your body weight to press your heel down over the edge of the step.  You should feel a stretch in your right / left calf.  Hold this position for __________ seconds.  Repeat this exercise with a slight bend in your right / left knee. Repeat  __________ times. Complete this stretch __________ times per day.  STRENGTHENING EXERCISES - Plantar Fasciitis (Heel Spur Syndrome)  These exercises may help you when beginning to rehabilitate your injury. They may resolve your symptoms with or without further involvement from your physician, physical therapist or athletic trainer. While completing these exercises, remember:   Muscles can gain both the endurance and the strength needed for everyday activities through controlled exercises.  Complete these exercises as instructed by your physician, physical therapist or athletic trainer. Progress the resistance and  repetitions only as guided. STRENGTH - Towel Curls  Sit in a chair positioned on a non-carpeted surface.  Place your foot on a towel, keeping your heel on the floor.  Pull the towel toward your heel by only curling your toes. Keep your heel on the floor.  If instructed by your physician, physical therapist or athletic trainer, add ____________________ at the end of the towel. Repeat __________ times. Complete this exercise __________ times per day. STRENGTH - Ankle Inversion  Secure one end of a rubber exercise band/tubing to a fixed object (table, pole). Loop the other end around your foot just before your toes.  Place your fists between your knees. This will focus your strengthening at your ankle.  Slowly, pull your big toe up and in, making sure the band/tubing is positioned to resist the entire motion.  Hold this position for __________ seconds.  Have your muscles resist the band/tubing as it slowly pulls your foot back to the starting position. Repeat __________ times. Complete this exercises __________ times per day.  Document Released: 07/01/2005 Document Revised: 09/23/2011 Document Reviewed: 10/13/2008 [facility] Patient Information 2015 [place], [place]. This information is not intended to replace advice given to you by your health care provider. Make sure you discuss any questions you have with your health care provider.

## 2014-12-29 NOTE — Progress Notes (Signed)
Patient ID: Wendy Estrada, female   DOB: Dec 15, 1955, 59 y.o.   MRN: [id number] New    Chief Complaint  Patient presents with  . Foot Pain    left heel pain     Wendy Estrada is a 59 y.o. female.   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 Symptoms pain bottom of the heel, quality sharp, timing constant but worse when she gets up in the morning and after sitting down and getting back up. Intensity of pain 7. Prior  x-ray shows a heel spur  Previous treatment she got an injection at urgent care and she took  Review of Systems See hpi  Past Medical History  Diagnosis Date  . Hypertension   . Thyroid disease     hypothyroidism    Past Surgical History  Procedure Laterality Date  . Cholecystectomy    . Appendectomy    . Abdominal hysterectomy      No family history on file.  Social History History  Substance Use Topics  . Smoking status: Former Games developer  . Smokeless tobacco: Not on file  . Alcohol Use: No    Allergies  Allergen Reactions  . Codeine     nausea    Current Outpatient Prescriptions  Medication Sig Dispense Refill  . cetirizine (ZYRTEC) 10 MG tablet Take 10 mg by mouth daily.    . hydrochlorothiazide (HYDRODIURIL) 25 MG tablet Take 25 mg by mouth daily.    Marland Kitchen levothyroxine (SYNTHROID, LEVOTHROID) 25 MCG tablet Take 25 mcg by mouth daily before breakfast.    . pantoprazole (PROTONIX) 20 MG tablet Take 1 tablet (20 mg total) by mouth daily. 90 tablet 1  . Potassium Chloride ER 20 MEQ TBCR Take 20 mEq by mouth daily.    . simvastatin (ZOCOR) 20 MG tablet Take 20 mg by mouth every evening.     No current facility-administered medications for this visit.       Physical Exam Blood pressure 158/92, height 5\' 5"  (1.651 m), weight 183 lb (83.008 kg). Physical Exam The patient is well developed well nourished and well groomed. Orientation to person place and time is normal  Mood is  pleasant. Ambulatory status  ambulatory status is without assistive devices and no noticeable limp Foot appears to be slightly flattened terms of her arch ankle range of motion appears to be normal Achilles tendon appears to be only slightly tight ankle stability normal with the drawer maneuver. Motor exam is normal no atrophy. Scans intact. Sensation is normal. Foot is warm to touch. No lymphadenopathy or angitis.   Data Rev x-ray reviewed independently interpreted as heel spur no arthritis  Assessment Encounter Diagnosis  Name Primary?  . Plantar fasciitis Yes   Plan  Night splint.  Prescription heel cup.  Ice.  Exercise.  Follow-up 6 weeks

## 2015-01-19 IMAGING — CT CT ANGIO CHEST
1 of 6 series · 5 of 36 positions shown · IV contrast (Omnipaque 300)
Comparison: None.

CLINICAL DATA: Chest pain

CT ANGIOGRAPHY CHEST
TECHNIQUE: Multidetector CT imaging of the chest using the
standard protocol during bolus administration of intravenous
contrast. Multiplanar reconstructed images including MIPs were
obtained and reviewed to evaluate the vascular anatomy.
Contrast: 100mL OMNIPAQUE IOHEXOL 350 MG/ML SOLN

[Series 4: pe 3.0 b40f · axial · 0.68mm/px · z∈[-227,-59]mm · 5 of 85 slices shown]
[im 15/85  lung]
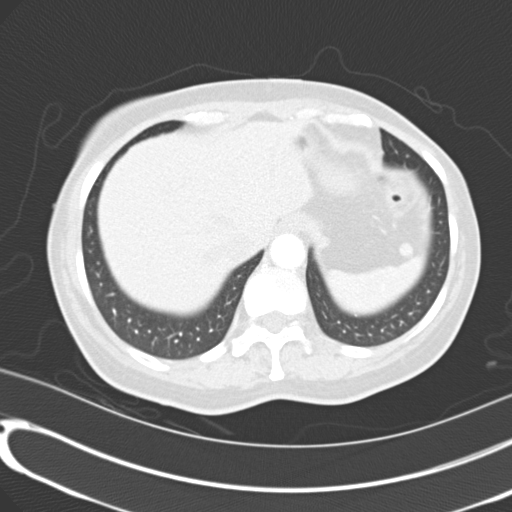
[im 29/85  mediastinal]
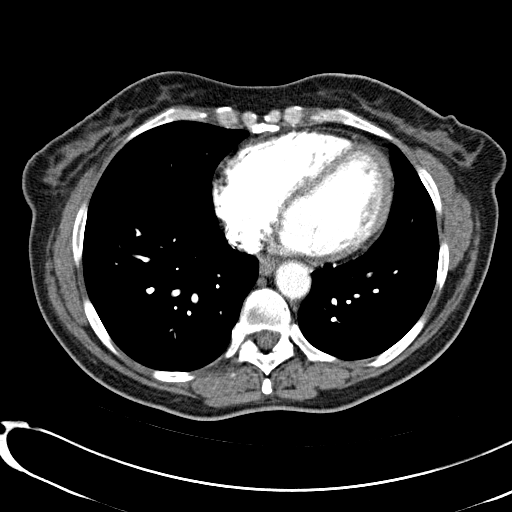
[im 43/85  lung]
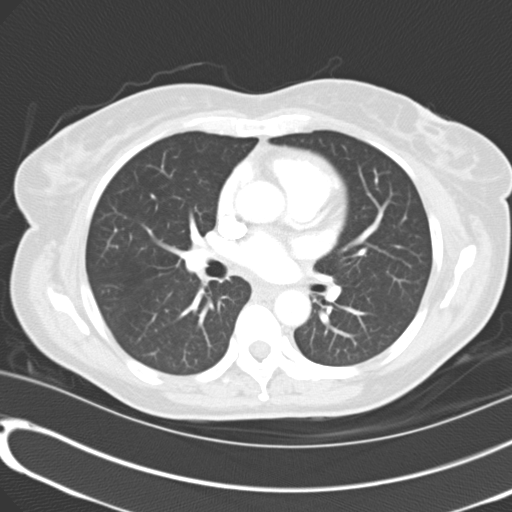
[im 57/85  mediastinal]
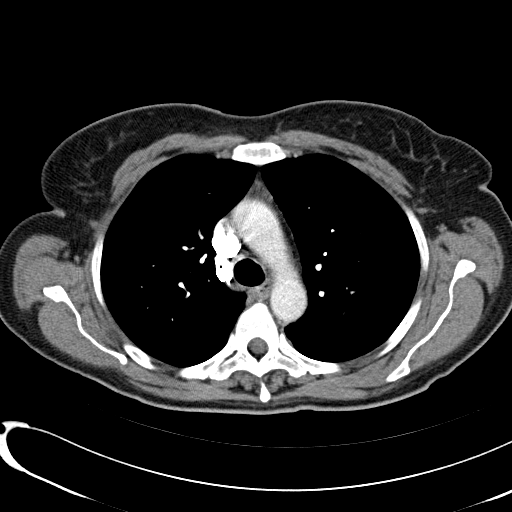
[im 71/85  lung]
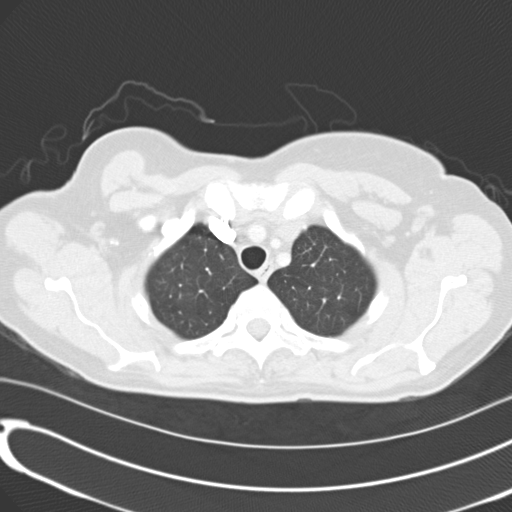

[5 of 36 positions shown; findings below may reference images not displayed]

FINDINGS: The lungs are clear bilaterally.  No focal infiltrate or
sizable effusion is seen.  The hilar and mediastinal structures are
within normal limits.  No lymphadenopathy is seen.  The thoracic
aorta is unremarkable.  The pulmonary artery is well visualized and
shows no filling defect to suggest pulmonary emboli.  The
visualized upper abdomen is within normal limits.  No bony
abnormality is identified.
IMPRESSION: No evidence of pulmonary emboli.  No acute abnormality is noted.

## 2015-01-19 IMAGING — CR DG CHEST 2V
2 series · 2 of 2 positions shown · non-contrast
Comparison: None.

CLINICAL DATA: Chest pain

CHEST - 2 VIEW

[view not recorded (1 of 2)]
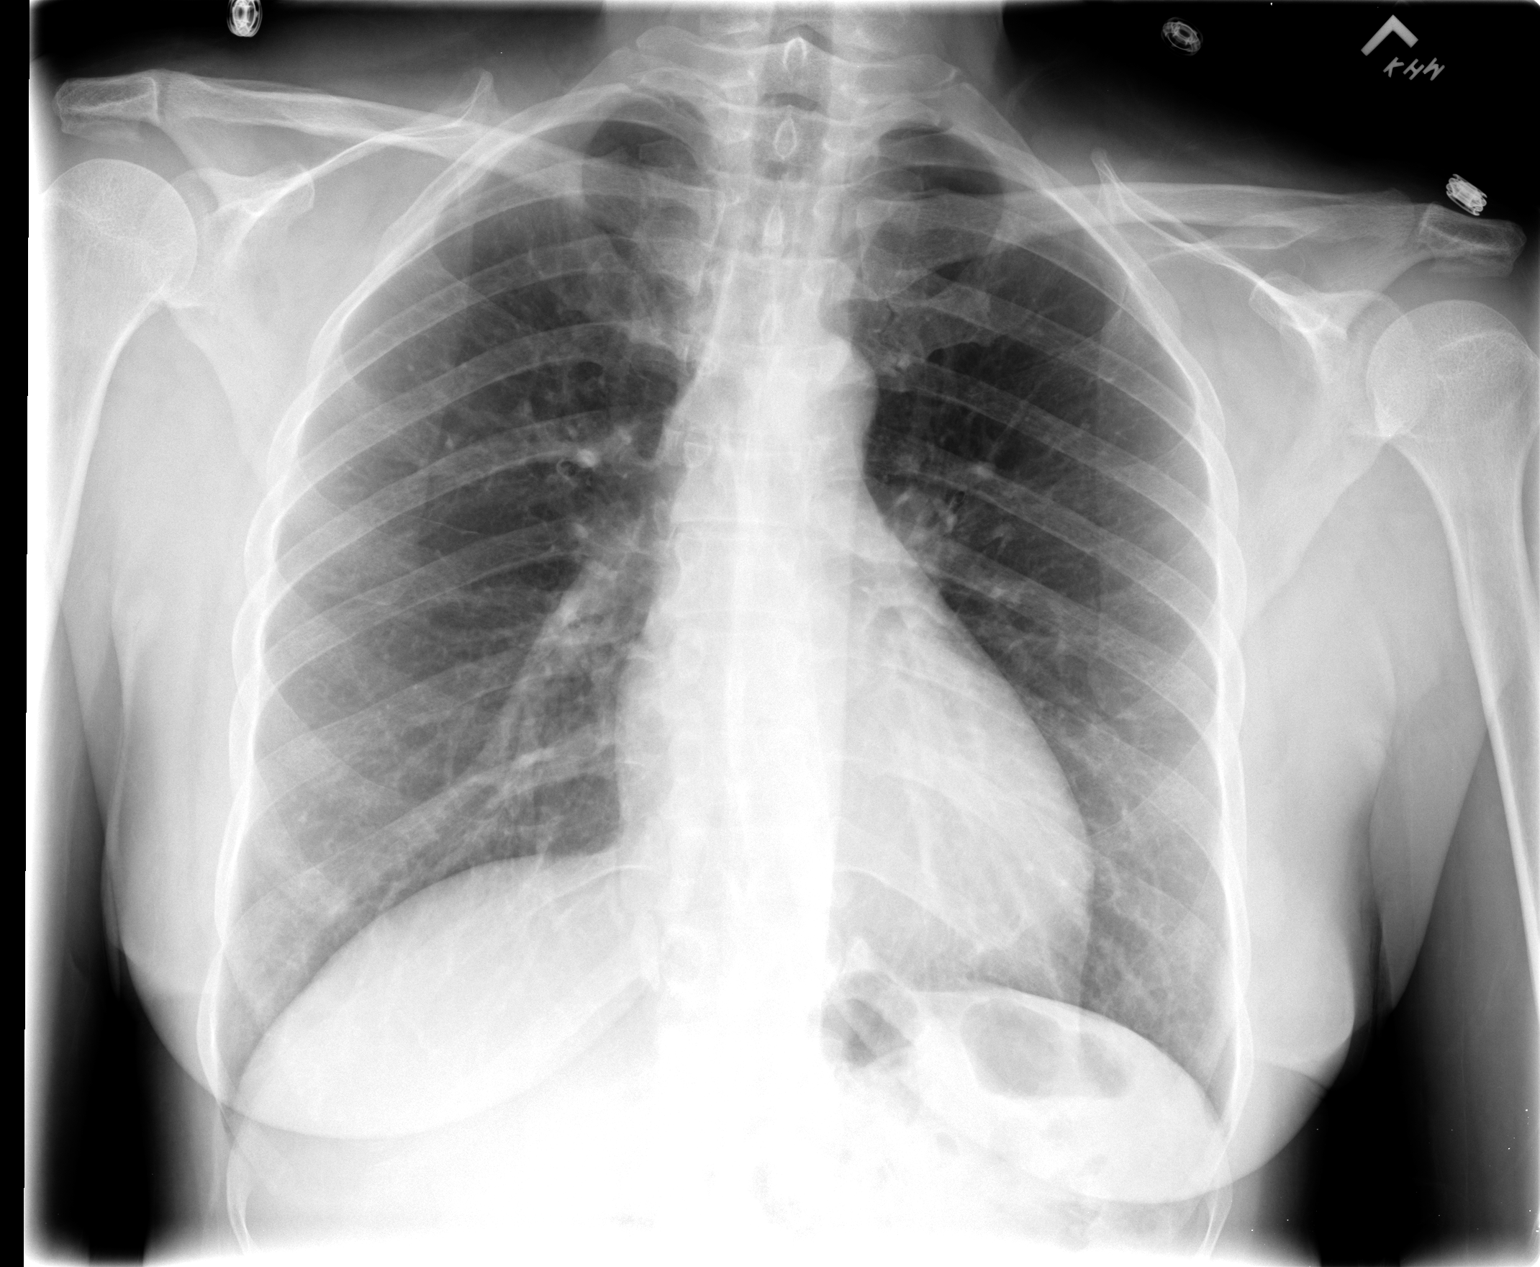

[view not recorded (2 of 2)]
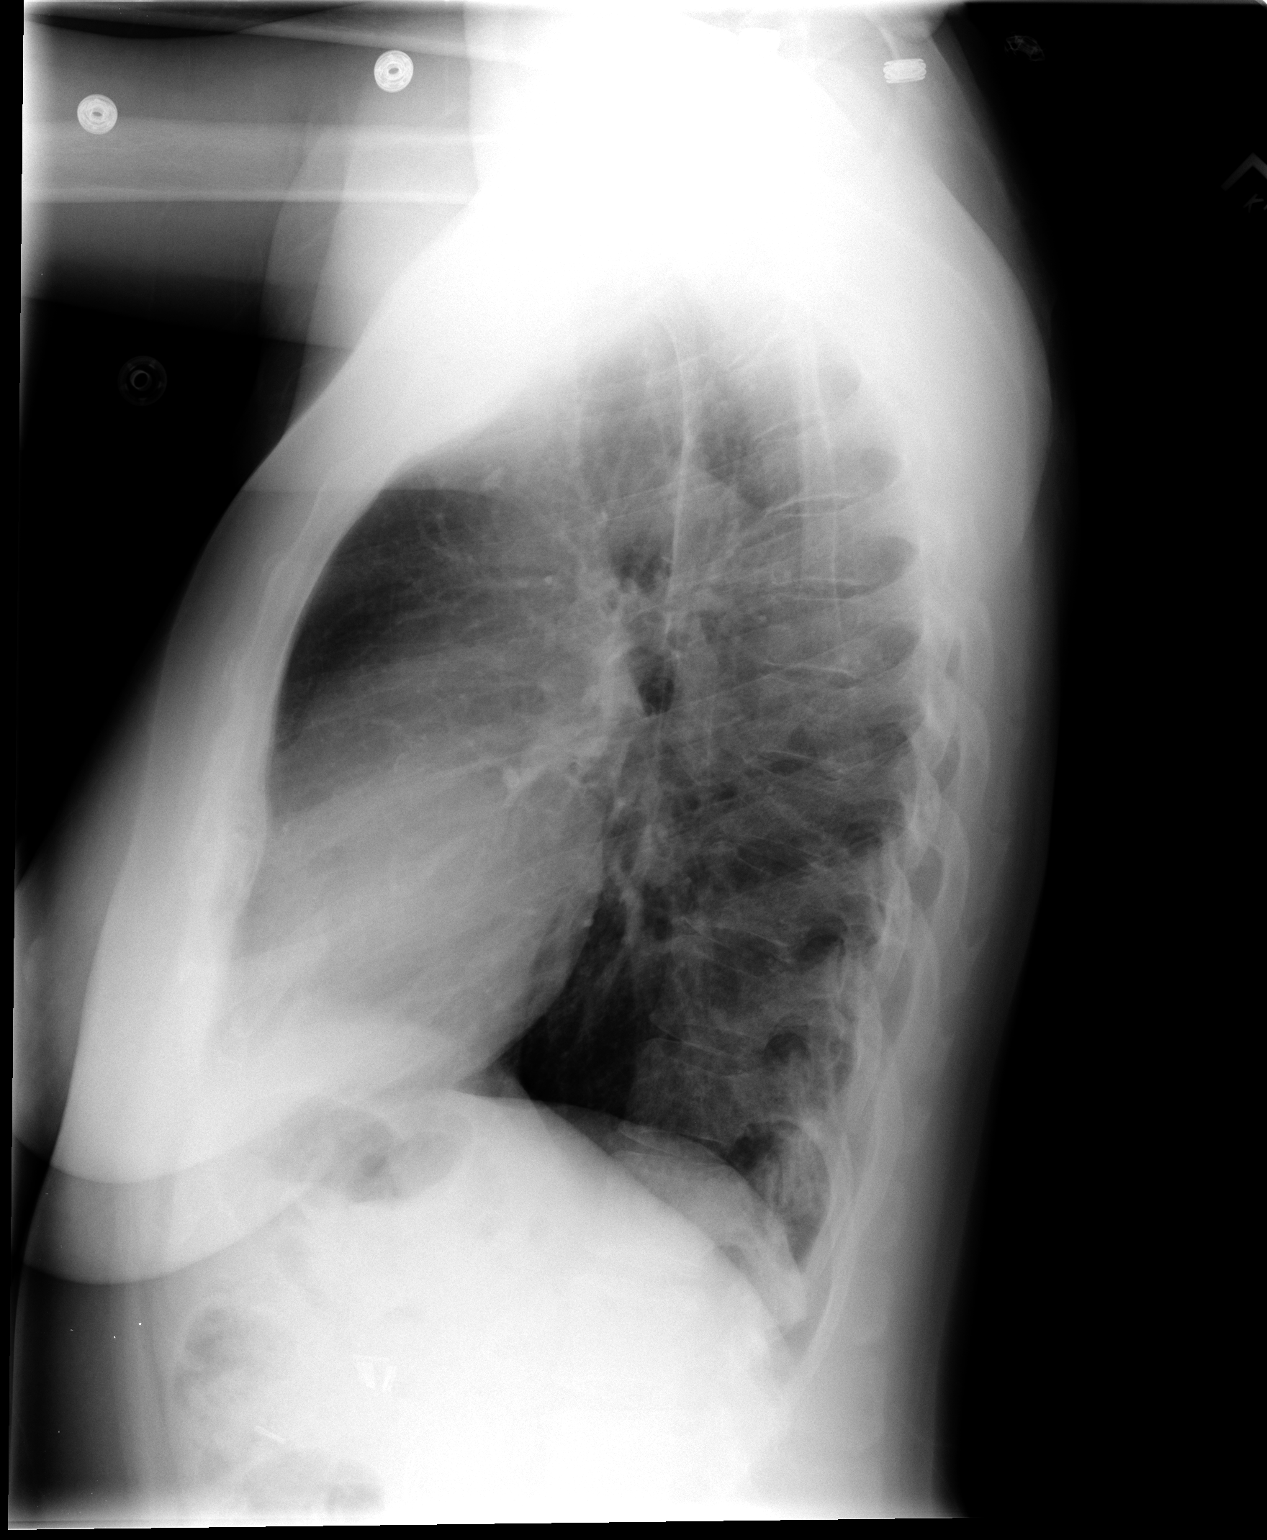

[2 of 2 positions shown; findings below may reference images not displayed]

FINDINGS: The heart pulmonary vascularity are within normal limits.
The lungs are clear bilaterally.  No acute bony abnormality is
seen.
IMPRESSION: No acute abnormality noted.

## 2015-02-20 ENCOUNTER — Ambulatory Visit (INDEPENDENT_AMBULATORY_CARE_PROVIDER_SITE_OTHER): Payer: PRIVATE HEALTH INSURANCE | Admitting: Orthopedic Surgery

## 2015-02-20 VITALS — BP 143/85 | Ht 65.0 in | Wt 183.0 lb

## 2015-02-20 DIAGNOSIS — M722 Plantar fascial fibromatosis: Secondary | ICD-10-CM | POA: Diagnosis not present

## 2015-02-20 NOTE — Progress Notes (Signed)
Patient ID: Wendy Estrada, female   DOB: 04-29-1956, 59 y.o.   MRN: [id number] Chief Complaint  Patient presents with  . Follow-up    6 week follow up left heel s/p home exercises    History 59 year old female treated for plantar fasciitis with home exercise program, heel cups, ice, anti-inflammatory. No improvement  Night splint also use did not help made her foot her.  She wore an ankle brace which helped she has pain on the medial side of the plantar aspect of her heel  Review of systems normal for symptoms of neurologic dysfunction  BP 143/85 mmHg  Ht  (1.651 m)  Wt 183 lb (83.008 kg)  BMI 30.45 kg/m2 We see mild flatness of the foot normal range of motion at the ankle tenderness over the medial plantar fascial band with a stable ankle joint, normal motor exam no sensory abnormalities and good pulses  Inject heel  Follow-up 6 weeks continue current exercise program.  She may bring the brace by for me to check to see why it's hurting her foot at night   Verbal consent was obtained timeout was used to confirm left heel as injection site A steroid injection was performed at medial plantar heel using 1% plain Lidocaine and 40 mg of depomedrol. This was well tolerated.

## 2015-04-03 ENCOUNTER — Encounter: Payer: Self-pay | Admitting: Orthopedic Surgery

## 2015-04-03 ENCOUNTER — Ambulatory Visit (INDEPENDENT_AMBULATORY_CARE_PROVIDER_SITE_OTHER): Payer: PRIVATE HEALTH INSURANCE | Admitting: Orthopedic Surgery

## 2015-04-03 VITALS — BP 153/85 | Ht 65.0 in | Wt 183.0 lb

## 2015-04-03 DIAGNOSIS — M722 Plantar fascial fibromatosis: Secondary | ICD-10-CM

## 2015-04-03 NOTE — Patient Instructions (Signed)
Injection today Home exercises Ice Boot when not working

## 2015-04-03 NOTE — Progress Notes (Signed)
Patient ID: ANAM BOBBY, female   DOB: 09-Mar-1956, 60 y.o.   MRN: [id number]  Follow up visit  Chief Complaint  Patient presents with  . Follow-up    follow up left heel s/p injection + brace    BP 153/85 mmHg  Ht  (1.651 m)  Wt 183 lb (83.008 kg)  BMI 30.45 kg/m2  Encounter Diagnosis  Name Primary?  . Plantar fasciitis Yes    The patient reports that the injection helped her a little bit.  Review of systems still having heel pain especially when she gets up especially walking 12 hours a day although it does resolve somewhat more ambulation until she sits again  No numbness or tingling  Tenderness at the plantar fascia  Physical exam unchanged from previous note dictated on  Recommend reinjection, Cam Walker when she's not working, exercises, ice, continue anti-inflammatory medication  Follow-up 6 weeks  Inject heel  Verbal consent was obtained patient consented to injection. Timeout completed to confirm injection site  A steroid injection was performed at left heel plantar fascia   using 1% plain Lidocaine and 40 mg Depo-Medrol. This was well tolerated.

## 2015-05-16 ENCOUNTER — Telehealth: Payer: Self-pay | Admitting: Orthopedic Surgery

## 2015-05-16 ENCOUNTER — Ambulatory Visit (INDEPENDENT_AMBULATORY_CARE_PROVIDER_SITE_OTHER): Payer: BLUE CROSS/BLUE SHIELD | Admitting: Orthopedic Surgery

## 2015-05-16 VITALS — BP 153/82 | Ht 65.0 in | Wt 183.0 lb

## 2015-05-16 DIAGNOSIS — M722 Plantar fascial fibromatosis: Secondary | ICD-10-CM | POA: Diagnosis not present

## 2015-05-16 NOTE — Patient Instructions (Addendum)
Continue wearing brace for 2 months and call us back in 2 months and let us know how you are doing.  Ice and exercise for the right foot.

## 2015-05-16 NOTE — Telephone Encounter (Signed)
No additional note

## 2015-05-17 ENCOUNTER — Encounter: Payer: Self-pay | Admitting: Orthopedic Surgery

## 2015-05-17 NOTE — Progress Notes (Signed)
Chief Complaint  Patient presents with  . Follow-up    6 week recheck on left heel after injection and bracing.    History this is a follow-up visit. The patient is being seen today for left plantar fasciitis status post injection which was repeated a second time on her last visit September 19. She reports decreased pain in her left heel improved walking distance and quality of walking. 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 She's been able to return exercise.  She comes in with a new complaint of aching mild pain right foot of several weeks duration.  Review of systems both feet have no neurologic symptoms at this time. There has been no rash and there has been no fever.  Past Medical History  Diagnosis Date  . Hypertension   . Thyroid disease     hypothyroidism    BP 153/82 mmHg  Ht 5\' 5"  (1.651 m)  Wt 183 lb (83.008 kg)  BMI 30.45 kg/m2 She is awake alert and oriented 3 mood and affect are normal her overall appearance is normal she is ambulatory with the boot on with a good heel-to-toe gait and on the left side we have no major tenderness full range of motion at the ankle joint, stable ankle motor exam intact on the left side we have mild tenderness in the plantar aspect of the heel full range of motion ankle stability intact and normal motor exam both feet exhibit normal skin sensation and vascularity with good pulses and capillary refill color and temperature  Impression left plantar fasciitis improved  Right plantar fasciitis new finding recommend ice and exercises  Patient will call in 2 months to discuss her progress. She will continue walking in her Cam Dan HumphreysWalker

## 2015-10-04 ENCOUNTER — Other Ambulatory Visit: Payer: Self-pay | Admitting: Internal Medicine

## 2015-10-04 DIAGNOSIS — Z1231 Encounter for screening mammogram for malignant neoplasm of breast: Secondary | ICD-10-CM

## 2015-10-17 ENCOUNTER — Other Ambulatory Visit (HOSPITAL_COMMUNITY): Payer: Self-pay | Admitting: Internal Medicine

## 2015-10-17 DIAGNOSIS — Z1231 Encounter for screening mammogram for malignant neoplasm of breast: Secondary | ICD-10-CM

## 2015-10-26 ENCOUNTER — Ambulatory Visit (HOSPITAL_COMMUNITY)
Admission: RE | Admit: 2015-10-26 | Discharge: 2015-10-26 | Disposition: A | Discharge status: Home / Self Care | Payer: BLUE CROSS/BLUE SHIELD | Source: Ambulatory Visit | Attending: Internal Medicine | Admitting: Internal Medicine

## 2015-10-26 DIAGNOSIS — Z1231 Encounter for screening mammogram for malignant neoplasm of breast: Secondary | ICD-10-CM | POA: Insufficient documentation

## 2016-01-04 DIAGNOSIS — Z6829 Body mass index (BMI) 29.0-29.9, adult: Secondary | ICD-10-CM | POA: Diagnosis not present

## 2016-01-04 DIAGNOSIS — I10 Essential (primary) hypertension: Secondary | ICD-10-CM | POA: Diagnosis not present

## 2016-01-04 DIAGNOSIS — E063 Autoimmune thyroiditis: Secondary | ICD-10-CM | POA: Diagnosis not present

## 2016-01-04 DIAGNOSIS — E782 Mixed hyperlipidemia: Secondary | ICD-10-CM | POA: Diagnosis not present

## 2016-01-04 DIAGNOSIS — Z1389 Encounter for screening for other disorder: Secondary | ICD-10-CM | POA: Diagnosis not present

## 2016-01-08 DIAGNOSIS — E063 Autoimmune thyroiditis: Secondary | ICD-10-CM | POA: Diagnosis not present

## 2016-01-08 DIAGNOSIS — I10 Essential (primary) hypertension: Secondary | ICD-10-CM | POA: Diagnosis not present

## 2016-01-08 DIAGNOSIS — Z1389 Encounter for screening for other disorder: Secondary | ICD-10-CM | POA: Diagnosis not present

## 2016-01-08 DIAGNOSIS — E782 Mixed hyperlipidemia: Secondary | ICD-10-CM | POA: Diagnosis not present

## 2016-07-25 DIAGNOSIS — Z23 Encounter for immunization: Secondary | ICD-10-CM | POA: Diagnosis not present

## 2016-09-03 DIAGNOSIS — Z6831 Body mass index (BMI) 31.0-31.9, adult: Secondary | ICD-10-CM | POA: Diagnosis not present

## 2016-09-03 DIAGNOSIS — K219 Gastro-esophageal reflux disease without esophagitis: Secondary | ICD-10-CM | POA: Diagnosis not present

## 2016-09-03 DIAGNOSIS — Z1389 Encounter for screening for other disorder: Secondary | ICD-10-CM | POA: Diagnosis not present

## 2016-09-03 DIAGNOSIS — J329 Chronic sinusitis, unspecified: Secondary | ICD-10-CM | POA: Diagnosis not present

## 2016-09-03 DIAGNOSIS — E063 Autoimmune thyroiditis: Secondary | ICD-10-CM | POA: Diagnosis not present

## 2016-09-03 DIAGNOSIS — Z0001 Encounter for general adult medical examination with abnormal findings: Secondary | ICD-10-CM | POA: Diagnosis not present

## 2016-09-06 DIAGNOSIS — E748 Other specified disorders of carbohydrate metabolism: Secondary | ICD-10-CM | POA: Diagnosis not present

## 2016-09-06 DIAGNOSIS — Z1389 Encounter for screening for other disorder: Secondary | ICD-10-CM | POA: Diagnosis not present

## 2016-11-29 DIAGNOSIS — H6123 Impacted cerumen, bilateral: Secondary | ICD-10-CM | POA: Diagnosis not present

## 2017-05-27 DIAGNOSIS — J329 Chronic sinusitis, unspecified: Secondary | ICD-10-CM | POA: Diagnosis not present

## 2017-05-27 DIAGNOSIS — J069 Acute upper respiratory infection, unspecified: Secondary | ICD-10-CM | POA: Diagnosis not present

## 2017-05-27 DIAGNOSIS — H6123 Impacted cerumen, bilateral: Secondary | ICD-10-CM | POA: Diagnosis not present

## 2017-09-09 DIAGNOSIS — E6609 Other obesity due to excess calories: Secondary | ICD-10-CM | POA: Diagnosis not present

## 2017-09-09 DIAGNOSIS — R7309 Other abnormal glucose: Secondary | ICD-10-CM | POA: Diagnosis not present

## 2017-09-09 DIAGNOSIS — Z23 Encounter for immunization: Secondary | ICD-10-CM | POA: Diagnosis not present

## 2017-09-09 DIAGNOSIS — Z1389 Encounter for screening for other disorder: Secondary | ICD-10-CM | POA: Diagnosis not present

## 2017-09-09 DIAGNOSIS — Z0000 Encounter for general adult medical examination without abnormal findings: Secondary | ICD-10-CM | POA: Diagnosis not present

## 2017-09-09 DIAGNOSIS — Z6830 Body mass index (BMI) 30.0-30.9, adult: Secondary | ICD-10-CM | POA: Diagnosis not present

## 2017-11-13 ENCOUNTER — Other Ambulatory Visit (HOSPITAL_COMMUNITY): Payer: Self-pay | Admitting: Internal Medicine

## 2017-11-13 DIAGNOSIS — Z1231 Encounter for screening mammogram for malignant neoplasm of breast: Secondary | ICD-10-CM

## 2017-11-15 ENCOUNTER — Other Ambulatory Visit: Payer: Self-pay

## 2017-11-15 ENCOUNTER — Encounter (HOSPITAL_COMMUNITY): Payer: Self-pay

## 2017-11-15 ENCOUNTER — Emergency Department (HOSPITAL_COMMUNITY)
Admission: EM | Admit: 2017-11-15 | Discharge: 2017-11-15 | Disposition: A | Discharge status: Home / Self Care | Payer: BLUE CROSS/BLUE SHIELD | Attending: Emergency Medicine | Admitting: Emergency Medicine

## 2017-11-15 DIAGNOSIS — Z87891 Personal history of nicotine dependence: Secondary | ICD-10-CM | POA: Diagnosis not present

## 2017-11-15 DIAGNOSIS — K529 Noninfective gastroenteritis and colitis, unspecified: Secondary | ICD-10-CM | POA: Insufficient documentation

## 2017-11-15 DIAGNOSIS — Z79899 Other long term (current) drug therapy: Secondary | ICD-10-CM | POA: Insufficient documentation

## 2017-11-15 DIAGNOSIS — E860 Dehydration: Secondary | ICD-10-CM | POA: Diagnosis not present

## 2017-11-15 DIAGNOSIS — R1084 Generalized abdominal pain: Secondary | ICD-10-CM | POA: Diagnosis not present

## 2017-11-15 DIAGNOSIS — I10 Essential (primary) hypertension: Secondary | ICD-10-CM | POA: Insufficient documentation

## 2017-11-15 DIAGNOSIS — E039 Hypothyroidism, unspecified: Secondary | ICD-10-CM | POA: Diagnosis not present

## 2017-11-15 LAB — COMPREHENSIVE METABOLIC PANEL WITH GFR
ALT: 18 U/L (ref 14–54)
AST: 20 U/L (ref 15–41)
Albumin: 3.4 g/dL — ABNORMAL LOW (ref 3.5–5.0)
Alkaline Phosphatase: 46 U/L (ref 38–126)
Anion gap: 10 (ref 5–15)
BUN: 13 mg/dL (ref 6–20)
CO2: 26 mmol/L (ref 22–32)
Calcium: 9 mg/dL (ref 8.9–10.3)
Chloride: 99 mmol/L — ABNORMAL LOW (ref 101–111)
Creatinine, Ser: 0.76 mg/dL (ref 0.44–1.00)
GFR calc Af Amer: >60 mL/min
GFR calc non Af Amer: >60 mL/min
Glucose, Bld: 113 mg/dL — ABNORMAL HIGH (ref 65–99)
Potassium: 3 mmol/L — ABNORMAL LOW (ref 3.5–5.1)
Sodium: 135 mmol/L (ref 135–145)
Total Bilirubin: 0.6 mg/dL (ref 0.3–1.2)
Total Protein: 7.1 g/dL (ref 6.5–8.1)

## 2017-11-15 LAB — CBC
HCT: 40.5 % (ref 36.0–46.0)
Hemoglobin: 13.3 g/dL (ref 12.0–15.0)
MCH: 31.7 pg (ref 26.0–34.0)
MCHC: 32.8 g/dL (ref 30.0–36.0)
MCV: 96.4 fL (ref 78.0–100.0)
Platelets: 200 10*3/uL (ref 150–400)
RBC: 4.2 MIL/uL (ref 3.87–5.11)
RDW: 14 % (ref 11.5–15.5)
WBC: 5.6 10*3/uL (ref 4.0–10.5)

## 2017-11-15 LAB — LIPASE, BLOOD: Lipase: 30 U/L (ref 11–51)

## 2017-11-15 MED ORDER — KETOROLAC TROMETHAMINE 30 MG/ML IJ SOLN
15.0000 mg | Freq: Once | INTRAMUSCULAR | Status: AC
  Administered 2017-11-15: 15 mg via INTRAVENOUS
  Filled 2017-11-15: qty 1

## 2017-11-15 MED ORDER — SODIUM CHLORIDE 0.9 % IV BOLUS
1000.0000 mL | Freq: Once | INTRAVENOUS | Status: AC
  Administered 2017-11-15: 1000 mL via INTRAVENOUS

## 2017-11-15 MED ORDER — ONDANSETRON 4 MG PO TBDP: ORAL_TABLET | ORAL | 0 refills | Status: DC

## 2017-11-15 MED ORDER — POTASSIUM CHLORIDE CRYS ER 20 MEQ PO TBCR
40.0000 meq | EXTENDED_RELEASE_TABLET | Freq: Once | ORAL | Status: AC
  Administered 2017-11-15: 40 meq via ORAL
  Filled 2017-11-15: qty 2

## 2017-11-15 NOTE — ED Provider Notes (Signed)
[facility] EMERGENCY DEPARTMENT Provider Note   CSN: [id number] Arrival date & time: 11/15/17  1325     History   Chief Complaint Chief Complaint  Patient presents with  . Abdominal Pain    HPI Wendy Estrada is a 62 y.o. female.  Patient complains of abdominal cramping diarrhea for 2 days.  Some nausea  The history is provided by the patient.  Abdominal Pain   This is a new problem. The current episode started 12 to 24 hours ago. The problem occurs constantly. The problem has not changed since onset.The pain is associated with an unknown factor. The pain is located in the generalized abdominal region. The quality of the pain is aching. The pain is at a severity of 5/10. The pain is moderate. Associated symptoms include diarrhea. Pertinent negatives include anorexia, frequency, hematuria and headaches. Nothing aggravates the symptoms. Nothing relieves the symptoms.    Past Medical History:  Diagnosis Date  . Hypertension   . Thyroid disease    hypothyroidism    Patient Active Problem List   Diagnosis Date Noted  . Chest pain 04/01/2013  . Hyperlipidemia 04/01/2013  . HTN (hypertension) 04/01/2013  . Tobacco abuse 04/01/2013    Past Surgical History:  Procedure Laterality Date  . ABDOMINAL HYSTERECTOMY    . APPENDECTOMY    . CHOLECYSTECTOMY       OB History   None      Home Medications    Prior to Admission medications   Medication Sig Start Date End Date Taking? Authorizing Provider  hydrochlorothiazide (HYDRODIURIL) 25 MG tablet Take 25 mg by mouth daily.   Yes [provider]  levothyroxine (SYNTHROID, LEVOTHROID) 25 MCG tablet Take 25 mcg by mouth daily before breakfast.   Yes [provider]  lisinopril-hydrochlorothiazide (PRINZIDE,ZESTORETIC) 20-25 MG tablet Take 1 tablet by mouth daily. 09/09/17  Yes [provider]  ondansetron (ZOFRAN ODT) 4 MG disintegrating tablet  ODT q4 hours prn nausea/vomit 11/15/17   Bethann Berkshire, MD  pantoprazole (PROTONIX) 20 MG tablet Take 1 tablet (20 mg total) by mouth daily. Patient not taking: Reported on 11/15/2017 01/18/14   Antoine Poche, MD    Family History No family history on file.  Social History Social History   Tobacco Use  . Smoking status: Former Smoker  Substance Use Topics  . Alcohol use: Yes    Alcohol/week: 0.6 oz    Types: 1 Glasses of wine per week    Comment: nightly  . Drug use: No     Allergies   Codeine   Review of Systems Review of Systems  Constitutional: Negative for appetite change and fatigue.  HENT: Negative for congestion, ear discharge and sinus pressure.   Eyes: Negative for discharge.  Respiratory: Negative for cough.   Cardiovascular: Negative for chest pain.  Gastrointestinal: Positive for abdominal pain and diarrhea. Negative for anorexia.  Genitourinary: Negative for frequency and hematuria.  Musculoskeletal: Negative for back pain.  Skin: Negative for rash.  Neurological: Negative for seizures and headaches.  Psychiatric/Behavioral: Negative for hallucinations.     Physical Exam Updated Vital Signs BP (!) 139/99 (BP Location: Right Arm)   Pulse 74   Temp 98.7 F (37.1 C) (Oral)   Resp 16   Ht  (1.651 m)   Wt 83 kg (183 lb)   SpO2 99%   BMI 30.45 kg/m   Physical Exam  Constitutional: She is oriented to person, place, and time. She appears well-developed.  HENT:  Head: Normocephalic.  Eyes: Conjunctivae and EOM are normal. No scleral icterus.  Neck: Neck supple. No thyromegaly present.  Cardiovascular: Normal rate and regular rhythm. Exam reveals no gallop and no friction rub.  No murmur heard. Pulmonary/Chest: No stridor. She has no wheezes. She has no rales. She exhibits no tenderness.  Abdominal: She exhibits no distension. There is tenderness. There is no rebound.  Musculoskeletal: Normal range of motion. She exhibits no edema.  Lymphadenopathy:    She has no cervical adenopathy.    Neurological: She is oriented to person, place, and time. She exhibits normal muscle tone. Coordination normal.  Skin: No rash noted. No erythema.  Psychiatric: She has a normal mood and affect. Her behavior is normal.     ED Treatments / Results  Labs (all labs ordered are listed, but only abnormal results are displayed) Labs Reviewed  COMPREHENSIVE METABOLIC PANEL - Abnormal; Notable for the following components:      Result Value   Potassium 3.0 (*)    Chloride 99 (*)    Glucose, Bld 113 (*)    Albumin 3.4 (*)    All other components within normal limits  LIPASE, BLOOD  CBC    EKG None  Radiology No results found.  Procedures Procedures (including critical care time)  Medications Ordered in ED Medications  sodium chloride 0.9 % bolus 1,000 mL (1,000 mLs Intravenous New Bag/Given 11/15/17 1609)  potassium chloride SA (K-DUR,KLOR-CON) CR tablet 40 mEq (has no administration in time range)  ketorolac (TORADOL) 30 MG/ML injection 15 mg (15 mg Intravenous Given 11/15/17 1610)     Initial Impression / Assessment and Plan / ED Course  I have reviewed the triage vital signs and the nursing notes.  Pertinent labs & imaging results that were available during my care of the patient were reviewed by me and considered in my medical decision making (see chart for details).     Patient with dehydration abdominal cramping and diarrhea.  Most likely related to viral syndrome.  Patient improved with fluids and Toradol.  She also had hypokalemia and was given some potassium p.o.  Patient will be sent home with Zofran told to take Tylenol or Motrin and follow-up with PCP if not improving  Final Clinical Impressions(s) / ED Diagnoses   Final diagnoses:  Gastroenteritis    ED Discharge Orders        Ordered    ondansetron (ZOFRAN ODT) 4 MG disintegrating tablet     11/15/17 1700       Bethann Berkshire, MD 11/15/17 1706

## 2017-11-15 NOTE — ED Triage Notes (Signed)
Pt reports cramp like lower abdominal pain since night approx 6 pm. Reports nausea and diarrhea. Last loose stool 20 mins ago

## 2017-11-15 NOTE — Discharge Instructions (Addendum)
Drink plenty of fluids and follow-up with your family doctor next week if not improving.  Take Tylenol or Motrin for pain

## 2017-11-19 ENCOUNTER — Ambulatory Visit (HOSPITAL_COMMUNITY)
Admission: RE | Admit: 2017-11-19 | Discharge: 2017-11-19 | Disposition: A | Discharge status: Home / Self Care | Payer: BLUE CROSS/BLUE SHIELD | Source: Ambulatory Visit | Attending: Internal Medicine | Admitting: Internal Medicine

## 2017-11-19 ENCOUNTER — Encounter (HOSPITAL_COMMUNITY): Payer: Self-pay

## 2017-11-19 DIAGNOSIS — Z1231 Encounter for screening mammogram for malignant neoplasm of breast: Secondary | ICD-10-CM | POA: Diagnosis not present

## 2017-12-11 DIAGNOSIS — Z1389 Encounter for screening for other disorder: Secondary | ICD-10-CM | POA: Diagnosis not present

## 2017-12-11 DIAGNOSIS — I10 Essential (primary) hypertension: Secondary | ICD-10-CM | POA: Diagnosis not present

## 2017-12-11 DIAGNOSIS — K5900 Constipation, unspecified: Secondary | ICD-10-CM | POA: Diagnosis not present

## 2017-12-11 DIAGNOSIS — E063 Autoimmune thyroiditis: Secondary | ICD-10-CM | POA: Diagnosis not present

## 2017-12-11 DIAGNOSIS — E663 Overweight: Secondary | ICD-10-CM | POA: Diagnosis not present

## 2017-12-11 DIAGNOSIS — Z6829 Body mass index (BMI) 29.0-29.9, adult: Secondary | ICD-10-CM | POA: Diagnosis not present

## 2017-12-23 ENCOUNTER — Encounter: Payer: Self-pay | Admitting: Internal Medicine

## 2018-03-19 ENCOUNTER — Ambulatory Visit: Payer: BLUE CROSS/BLUE SHIELD | Admitting: Gastroenterology

## 2018-03-19 ENCOUNTER — Encounter: Payer: Self-pay | Admitting: Gastroenterology

## 2018-03-19 ENCOUNTER — Other Ambulatory Visit: Payer: Self-pay

## 2018-03-19 DIAGNOSIS — K5900 Constipation, unspecified: Secondary | ICD-10-CM | POA: Insufficient documentation

## 2018-03-19 DIAGNOSIS — K219 Gastro-esophageal reflux disease without esophagitis: Secondary | ICD-10-CM

## 2018-03-19 DIAGNOSIS — R198 Other specified symptoms and signs involving the digestive system and abdomen: Secondary | ICD-10-CM

## 2018-03-19 MED ORDER — PANTOPRAZOLE SODIUM 20 MG PO TBEC: 20.0000 mg | DELAYED_RELEASE_TABLET | Freq: Every day | ORAL | 1 refills | Status: DC

## 2018-03-19 MED ORDER — LINACLOTIDE 145 MCG PO CAPS
145.0000 ug | ORAL_CAPSULE | Freq: Every day | ORAL | 1 refills | Status: DC
Start: 2018-03-19 — End: 2018-12-09

## 2018-03-19 MED ORDER — NA SULFATE-K SULFATE-MG SULF 17.5-3.13-1.6 GM/177ML PO SOLN: 1.0000 | ORAL | 0 refills | Status: DC

## 2018-03-19 NOTE — Assessment & Plan Note (Signed)
95-month history of change in bowel habits.  Tendency towards constipation.  MiraLAX seems to help but she does not take it regularly.  She is interested in prescription options.  Begin Linzess 145 mcg daily on an empty stomach.  Plan for updated colonoscopy in the near future.  I have discussed the risks, alternatives, benefits with regards to but not limited to the risk of reaction to medication, bleeding, infection, perforation and the patient is agreeable to proceed. Written consent to be obtained.

## 2018-03-19 NOTE — Progress Notes (Signed)
Primary Care Physician:  Elfredia Nevins, MD  Primary Gastroenterologist:  Roetta Sessions, MD   Chief Complaint  Patient presents with  . Consult    last TCS was almost 10 yrs ago  . Constipation    has to take medication to have a BM  . Abdominal Pain    HPI:  Wendy Estrada is a 62 y.o. female here at the request of Dr. Sherwood Gambler to schedule colonoscopy.  Patient's last colonoscopy was in 2010, she had a benign rectal polyp removed.  No family history of colon cancer.  She has had a change in bowel habits since March.  Used to have regular BMs daily.  Around March she started having diarrhea but then this turned to constipation.  She has a bowel movement about 2-3 times per week, utilizes MiraLAX 1 capful about twice per week.  Stools are soft.  Denies rectal pain.  No blood in the stool or melena.  States she is to have intermittent rectal pain which she felt was hemorrhoid related.  However with these current change in bowel habits she has not had any issues with rectal pain.  Also has intermittent heartburn, previously had been on pantoprazole but symptoms are under control when she came off medication.  She did well up until a few months ago.  She has heartburn about 3-4 times per week, mostly at night after laying down.  She admits to eating late at night in part due to her work schedule.  Like a refill on pantoprazole.  Denies any alarm symptoms.  No dysphagia.  No weight loss.  Current Outpatient Medications  Medication Sig Dispense Refill  . hydrochlorothiazide (HYDRODIURIL) 25 MG tablet Take 25 mg by mouth daily.    Marland Kitchen levothyroxine (SYNTHROID, LEVOTHROID) 25 MCG tablet Take 25 mcg by mouth daily before breakfast.    . lisinopril-hydrochlorothiazide (PRINZIDE,ZESTORETIC) 20-25 MG tablet Take 1 tablet by mouth daily.  1  . ondansetron (ZOFRAN ODT) 4 MG disintegrating tablet 4mg  ODT q4 hours prn nausea/vomit 12 tablet 0  . polyethylene glycol (MIRALAX / GLYCOLAX) packet Take 17 g by mouth  daily as needed.    . pantoprazole (PROTONIX) 20 MG tablet Take 1 tablet (20 mg total) by mouth daily. (Patient not taking: Reported on 03/19/2018) 90 tablet 1   No current facility-administered medications for this visit.     Allergies as of 03/19/2018 - Review Complete 03/19/2018  Allergen Reaction Noted  . Codeine  03/13/2013    Past Medical History:  Diagnosis Date  . Hypertension   . Thyroid disease    hypothyroidism    Past Surgical History:  Procedure Laterality Date  . ABDOMINAL HYSTERECTOMY    . APPENDECTOMY    . BREAST BIOPSY Right    negative  . CHOLECYSTECTOMY    . COLONOSCOPY  2010   Dr. Jena Gauss: Terminal ileum normal.  Single benign rectal polyp removed.    Family History  Problem Relation Age of Onset  . Colon cancer Neg Hx     Social History   Socioeconomic History  . Marital status: Married    Spouse name: Not on file  . Number of children: Not on file  . Years of education: Not on file  . Highest education level: Not on file  Occupational History  . Not on file  Social Needs  . Financial resource strain: Not on file  . Food insecurity:    Worry: Not on file    Inability: Not on file  .  Transportation needs:    Medical: Not on file    Non-medical: Not on file  Tobacco Use  . Smoking status: Former Games developer  . Smokeless tobacco: Never Used  Substance and Sexual Activity  . Alcohol use: Yes    Comment: nightly 4oz red wine  . Drug use: No  . Sexual activity: Yes  Lifestyle  . Physical activity:    Days per week: Not on file    Minutes per session: Not on file  . Stress: Not on file  Relationships  . Social connections:    Talks on phone: Not on file    Gets together: Not on file    Attends religious service: Not on file    Active member of club or organization: Not on file    Attends meetings of clubs or organizations: Not on file    Relationship status: Not on file  . Intimate partner violence:    Fear of current or ex partner: Not  on file    Emotionally abused: Not on file    Physically abused: Not on file    Forced sexual activity: Not on file  Other Topics Concern  . Not on file  Social History Narrative  . Not on file      ROS:  General: Negative for anorexia, weight loss, fever, chills, fatigue, weakness. Eyes: Negative for vision changes.  ENT: Negative for hoarseness, difficulty swallowing , nasal congestion. CV: Negative for chest pain, angina, palpitations, dyspnea on exertion, peripheral edema.  Respiratory: Negative for dyspnea at rest, dyspnea on exertion, cough, sputum, wheezing.  GI: See history of present illness. GU:  Negative for dysuria, hematuria, urinary incontinence, urinary frequency, nocturnal urination.  MS: Negative for joint pain, low back pain.  Derm: Negative for rash or itching.  Neuro: Negative for weakness, abnormal sensation, seizure, frequent headaches, memory loss, confusion.  Psych: Negative for anxiety, depression, suicidal ideation, hallucinations.  Endo: Negative for unusual weight change.  Heme: Negative for bruising or bleeding. Allergy: Negative for rash or hives.    Physical Examination:  BP (!) 147/89   Pulse 66   Temp (!) 97.5 F (36.4 C) (Oral)   Ht 5\' 5"  (1.651 m)   Wt 184 lb (83.5 kg)   BMI 30.62 kg/m    General: Well-nourished, well-developed in no acute distress.  Head: Normocephalic, atraumatic.   Eyes: Conjunctiva pink, no icterus. Mouth: Oropharyngeal mucosa moist and pink , no lesions erythema or exudate. Neck: Supple without thyromegaly, masses, or lymphadenopathy.  Lungs: Clear to auscultation bilaterally.  Heart: Regular rate and rhythm, no murmurs rubs or gallops.  Abdomen: Bowel sounds are normal, nontender, nondistended, no hepatosplenomegaly or masses, no abdominal bruits or    hernia , no rebound or guarding.   Rectal: Not performed Extremities: No lower extremity edema. No clubbing or deformities.  Neuro: Alert and oriented x 4 ,  grossly normal neurologically.  Skin: Warm and dry, no rash or jaundice.   Psych: Alert and cooperative, normal mood and affect.    Imaging Studies: No results found.

## 2018-03-19 NOTE — Patient Instructions (Signed)
1. Take pantoprazole once daily before breakfast as needed for acid reflux.  2. Take Linzess 145 mcg daily on empty stomach for constipation. Use rebate card to reduce cost.  3. Colonoscopy as scheduled. See separate instructions.

## 2018-03-19 NOTE — Assessment & Plan Note (Addendum)
Discussed antireflux measures.  Resume pantoprazole 40 mg daily as needed.  Prescription provided.

## 2018-03-20 NOTE — Progress Notes (Signed)
CC'D TO PCP °

## 2018-04-22 ENCOUNTER — Encounter (HOSPITAL_COMMUNITY)
Admission: RE | Disposition: A | Discharge status: Home / Self Care | Payer: Self-pay | Source: Ambulatory Visit | Attending: Internal Medicine

## 2018-04-22 ENCOUNTER — Ambulatory Visit (HOSPITAL_COMMUNITY)
Admission: RE | Admit: 2018-04-22 | Discharge: 2018-04-22 | Disposition: A | Discharge status: Home / Self Care | Payer: BLUE CROSS/BLUE SHIELD | Source: Ambulatory Visit | Attending: Internal Medicine | Admitting: Internal Medicine

## 2018-04-22 ENCOUNTER — Encounter (HOSPITAL_COMMUNITY): Payer: Self-pay

## 2018-04-22 ENCOUNTER — Other Ambulatory Visit: Payer: Self-pay

## 2018-04-22 DIAGNOSIS — Z87891 Personal history of nicotine dependence: Secondary | ICD-10-CM | POA: Insufficient documentation

## 2018-04-22 DIAGNOSIS — Z1211 Encounter for screening for malignant neoplasm of colon: Secondary | ICD-10-CM | POA: Diagnosis not present

## 2018-04-22 DIAGNOSIS — E039 Hypothyroidism, unspecified: Secondary | ICD-10-CM | POA: Diagnosis not present

## 2018-04-22 DIAGNOSIS — Z79899 Other long term (current) drug therapy: Secondary | ICD-10-CM | POA: Insufficient documentation

## 2018-04-22 DIAGNOSIS — Z79890 Hormone replacement therapy (postmenopausal): Secondary | ICD-10-CM | POA: Diagnosis not present

## 2018-04-22 DIAGNOSIS — I10 Essential (primary) hypertension: Secondary | ICD-10-CM | POA: Diagnosis not present

## 2018-04-22 DIAGNOSIS — K5900 Constipation, unspecified: Secondary | ICD-10-CM | POA: Diagnosis not present

## 2018-04-22 DIAGNOSIS — R198 Other specified symptoms and signs involving the digestive system and abdomen: Secondary | ICD-10-CM

## 2018-04-22 HISTORY — PX: COLONOSCOPY: SHX5424

## 2018-04-22 SURGERY — COLONOSCOPY
Anesthesia: Moderate Sedation

## 2018-04-22 MED ORDER — MIDAZOLAM HCL 5 MG/5ML IJ SOLN
INTRAMUSCULAR | Status: AC
  Filled 2018-04-22: qty 10

## 2018-04-22 MED ORDER — STERILE WATER FOR IRRIGATION IR SOLN
Status: DC | PRN
  Administered 2018-04-22: 4 mL

## 2018-04-22 MED ORDER — MEPERIDINE HCL 100 MG/ML IJ SOLN
INTRAMUSCULAR | Status: DC | PRN
  Administered 2018-04-22: 25 mg via INTRAVENOUS
  Administered 2018-04-22: 15 mg via INTRAVENOUS

## 2018-04-22 MED ORDER — MEPERIDINE HCL 50 MG/ML IJ SOLN
INTRAMUSCULAR | Status: AC
  Filled 2018-04-22: qty 1

## 2018-04-22 MED ORDER — SODIUM CHLORIDE 0.9 % IV SOLN
INTRAVENOUS | Status: DC
  Administered 2018-04-22: 11:00:00 via INTRAVENOUS

## 2018-04-22 MED ORDER — ONDANSETRON HCL 4 MG/2ML IJ SOLN
INTRAMUSCULAR | Status: AC
  Filled 2018-04-22: qty 2

## 2018-04-22 MED ORDER — ONDANSETRON HCL 4 MG/2ML IJ SOLN
INTRAMUSCULAR | Status: DC | PRN
  Administered 2018-04-22: 4 mg via INTRAVENOUS

## 2018-04-22 MED ORDER — MIDAZOLAM HCL 5 MG/5ML IJ SOLN
INTRAMUSCULAR | Status: DC | PRN
  Administered 2018-04-22: 2 mg via INTRAVENOUS
  Administered 2018-04-22 (×2): 1 mg via INTRAVENOUS

## 2018-04-22 NOTE — Discharge Instructions (Signed)
°  Colonoscopy Discharge Instructions  Read the instructions outlined below and refer to this sheet in the next few weeks. These discharge instructions provide you with general information on caring for yourself after you leave the hospital. Your doctor may also give you specific instructions. While your treatment has been planned according to the most current medical practices available, unavoidable complications occasionally occur. If you have any problems or questions after discharge, call Dr. Jena Gauss at [phone]. ACTIVITY  You may resume your regular activity, but move at a slower pace for the next 24 hours.   Take frequent rest periods for the next 24 hours.   Walking will help get rid of the air and reduce the bloated feeling in your belly (abdomen).   No driving for 24 hours (because of the medicine (anesthesia) used during the test).    Do not sign any important legal documents or operate any machinery for 24 hours (because of the anesthesia used during the test).  NUTRITION  Drink plenty of fluids.   You may resume your normal diet as instructed by your doctor.   Begin with a light meal and progress to your normal diet. Heavy or fried foods are harder to digest and may make you feel sick to your stomach (nauseated).   Avoid alcoholic beverages for 24 hours or as instructed.  MEDICATIONS  You may resume your normal medications unless your doctor tells you otherwise.  WHAT YOU CAN EXPECT TODAY  Some feelings of bloating in the abdomen.   Passage of more gas than usual.   Spotting of blood in your stool or on the toilet paper.  IF YOU HAD POLYPS REMOVED DURING THE COLONOSCOPY:  No aspirin products for 7 days or as instructed.   No alcohol for 7 days or as instructed.   Eat a soft diet for the next 24 hours.  FINDING OUT THE RESULTS OF YOUR TEST Not all test results are available during your visit. If your test results are not back during the visit, make an appointment  with your caregiver to find out the results. Do not assume everything is normal if you have not heard from your caregiver or the medical facility. It is important for you to follow up on all of your test results.  SEEK IMMEDIATE MEDICAL ATTENTION IF:  You have more than a spotting of blood in your stool.   Your belly is swollen (abdominal distention).   You are nauseated or vomiting.   You have a temperature over 101.   You have abdominal pain or discomfort that is severe or gets worse throughout the day.    Repeat screening colonoscopy in 10 years  Continue Linzess 145 daily  Office visit with [place] in 6 months

## 2018-04-22 NOTE — Op Note (Signed)
[facility] Patient Name: Wendy Estrada Procedure Date: 04/22/2018 10:40 AM MRN: [id number] Date of Birth: 20-Feb-1956 Attending MD: Gennette Pac , MD CSN: [id number] Age: 62 Admit Type: Outpatient Procedure:                Colonoscopy Indications:              Screening for colorectal malignant neoplasm Providers:                Gennette Pac, MD, Judee Clara, RN, Dyann Ruddle Referring MD:              Medicines:                Midazolam 4 mg IV, Meperidine 40 mg IV Complications:            No immediate complications. Estimated Blood Loss:     Estimated blood loss: none. Procedure:                Pre-Anesthesia Assessment:                           - Prior to the procedure, a History and Physical                            was performed, and patient medications and                            allergies were reviewed. The patient's tolerance of                            previous anesthesia was also reviewed. The risks                            and benefits of the procedure and the sedation                            options and risks were discussed with the patient.                            All questions were answered, and informed consent                            was obtained. ASA Grade Assessment: II - A patient                            with mild systemic disease. After reviewing the                            risks and benefits, the patient was deemed in                            satisfactory condition to undergo the procedure.  After obtaining informed consent, the colonoscope                            was passed under direct vision. Throughout the                            procedure, the patient's blood pressure, pulse, and                            oxygen saturations were monitored continuously. The                            CF-[id number] ([id number]) scope was introduced through             the anus and advanced to the the cecum, identified                            by appendiceal orifice and ileocecal valve. The                            colonoscopy was performed without difficulty. The                            patient tolerated the procedure well. The quality                            of the bowel preparation was adequate. Scope In: 11:07:04 AM Scope Out: 11:19:27 AM Scope Withdrawal Time: 0 hours 6 minutes 15 seconds  Total Procedure Duration: 0 hours 12 minutes 23 seconds  Findings:      The perianal and digital rectal examinations were normal.      The exam was otherwise without abnormality on direct and retroflexion       views. Impression:               - The examination was otherwise normal on direct                            and retroflexion views.                           - No specimens collected. Moderate Sedation:      Moderate (conscious) sedation was administered by the endoscopy nurse       and supervised by the endoscopist. The following parameters were       monitored: oxygen saturation, heart rate, blood pressure, respiratory       rate, EKG, adequacy of pulmonary ventilation, and response to care.       Total physician intraservice time was 22 minutes. Recommendation:           - Patient has a contact number available for                            emergencies. The signs and symptoms of potential                            delayed complications were discussed  with the                            patient. Return to normal activities tomorrow.                            Written discharge instructions were provided to the                            patient.                           - Advance diet as tolerated.                           - Continue present medications.                           - Repeat colonoscopy in 10 years for screening                            purposes. Continue Linzess 145 daily Procedure Code(s):        ---  Professional ---                           [phone], Colonoscopy, flexible; diagnostic, including                            collection of specimen(s) by brushing or washing,                            when performed (separate procedure)                           G0500, Moderate sedation services provided by the                            same physician or other qualified health care                            professional performing a gastrointestinal                            endoscopic service that sedation supports,                            requiring the presence of an independent trained                            observer to assist in the monitoring of the                            patient's level of consciousness and physiological                            status; initial 15 minutes of intra-service time;  patient age 28 years or older (additional time may                            be reported with [id number], as appropriate) Diagnosis Code(s):        --- Professional ---                           Z12.11, Encounter for screening for malignant                            neoplasm of colon CPT copyright 2018 [employer]. All rights reserved. The codes documented in this report are preliminary and upon coder review may  be revised to meet current compliance requirements. Gerrit Friends. Nikolis Berent, MD Gennette Pac, MD 04/22/2018 11:23:59 AM This report has been signed electronically. Number of Addenda: 0

## 2018-04-22 NOTE — H&P (Signed)
@LOGO @   Primary Care Physician:  Elfredia Nevins, MD Primary Gastroenterologist:  Dr. Jena Gauss  Pre-Procedure History & Physical: HPI:  Wendy Estrada is a 62 y.o. female here for screening colonoscopy;  about 10 years since her last exam.  Recent constipation treated with Linzess.  States Linzess 145 is a "great medicine".  This is been associated with resolution of her bowel complaints.  Past Medical History:  Diagnosis Date  . Hypertension   . Thyroid disease    hypothyroidism    Past Surgical History:  Procedure Laterality Date  . ABDOMINAL HYSTERECTOMY    . APPENDECTOMY    . BREAST BIOPSY Right    negative  . CHOLECYSTECTOMY    . COLONOSCOPY  2010   Dr. Jena Gauss: Terminal ileum normal.  Single benign rectal polyp removed.    Prior to Admission medications   Medication Sig Start Date End Date Taking? Authorizing Provider  amLODipine (NORVASC) 5 MG tablet Take 5 mg by mouth daily. 03/08/18  Yes [provider]  hydrochlorothiazide (HYDRODIURIL) 25 MG tablet Take 25 mg by mouth daily.   Yes [provider]  levothyroxine (SYNTHROID, LEVOTHROID) 25 MCG tablet Take 25 mcg by mouth daily before breakfast.   Yes [provider]  linaclotide (LINZESS) 145 MCG CAPS capsule Take 1 capsule (145 mcg total) by mouth daily before breakfast. 03/19/18  Yes Tiffany Kocher, PA-C  ondansetron (ZOFRAN ODT) 4 MG disintegrating tablet 4mg  ODT q4 hours prn nausea/vomit Patient taking differently: Take 4 mg by mouth every 4 (four) hours as needed for nausea or vomiting.  11/15/17  Yes Bethann Berkshire, MD  pantoprazole (PROTONIX) 20 MG tablet Take 1 tablet (20 mg total) by mouth daily before breakfast. 03/19/18  Yes Tiffany Kocher, PA-C    Allergies as of 03/19/2018 - Review Complete 03/19/2018  Allergen Reaction Noted  . Codeine  03/13/2013    Family History  Problem Relation Age of Onset  . Colon cancer Neg Hx     Social History   Socioeconomic History  . Marital  status: Married    Spouse name: Not on file  . Number of children: Not on file  . Years of education: Not on file  . Highest education level: Not on file  Occupational History  . Not on file  Social Needs  . Financial resource strain: Not on file  . Food insecurity:    Worry: Not on file    Inability: Not on file  . Transportation needs:    Medical: Not on file    Non-medical: Not on file  Tobacco Use  . Smoking status: Former Games developer  . Smokeless tobacco: Never Used  Substance and Sexual Activity  . Alcohol use: Yes    Comment: nightly 4oz red wine  . Drug use: No  . Sexual activity: Yes  Lifestyle  . Physical activity:    Days per week: Not on file    Minutes per session: Not on file  . Stress: Not on file  Relationships  . Social connections:    Talks on phone: Not on file    Gets together: Not on file    Attends religious service: Not on file    Active member of club or organization: Not on file    Attends meetings of clubs or organizations: Not on file    Relationship status: Not on file  . Intimate partner violence:    Fear of current or ex partner: Not on file    Emotionally  abused: Not on file    Physically abused: Not on file    Forced sexual activity: Not on file  Other Topics Concern  . Not on file  Social History Narrative  . Not on file    Review of Systems: See HPI, otherwise negative ROS  Physical Exam: BP (!) 159/83   Pulse 88   Temp 98.3 F (36.8 C) (Oral)   Resp 16   Ht 5\' 5"  (1.651 m)   Wt 83.5 kg   SpO2 99%   BMI 30.62 kg/m  General:   Alert,  Well-developed, well-nourished, pleasant and cooperative in NAD Lungs:  Clear throughout to auscultation.   No wheezes, crackles, or rhonchi. No acute distress. Heart:  Regular rate and rhythm; no murmurs, clicks, rubs,  or gallops. Abdomen: Non-distended, normal bowel sounds.  Soft and nontender without appreciable mass or hepatosplenomegaly.  Pulses:  Normal pulses noted. Extremities:   Without clubbing or edema.  Impression/Plan:  62 y/o lady with recent constipation well managed with Linzess.  Here for screening colonoscopy.  The risks, benefits, limitations, alternatives and imponderables have been reviewed with the patient. Questions have been answered. All parties are agreeable.      Notice: This dictation was prepared with Dragon dictation along with smaller phrase technology. Any transcriptional errors that result from this process are unintentional and may not be corrected upon review.

## 2018-04-28 ENCOUNTER — Encounter (HOSPITAL_COMMUNITY): Payer: Self-pay | Admitting: Internal Medicine

## 2018-07-14 DIAGNOSIS — Z1389 Encounter for screening for other disorder: Secondary | ICD-10-CM | POA: Diagnosis not present

## 2018-07-14 DIAGNOSIS — E6609 Other obesity due to excess calories: Secondary | ICD-10-CM | POA: Diagnosis not present

## 2018-07-14 DIAGNOSIS — I10 Essential (primary) hypertension: Secondary | ICD-10-CM | POA: Diagnosis not present

## 2018-07-14 DIAGNOSIS — Z6830 Body mass index (BMI) 30.0-30.9, adult: Secondary | ICD-10-CM | POA: Diagnosis not present

## 2018-07-14 DIAGNOSIS — H6123 Impacted cerumen, bilateral: Secondary | ICD-10-CM | POA: Diagnosis not present

## 2018-07-29 ENCOUNTER — Telehealth: Payer: Self-pay

## 2018-07-29 NOTE — Telephone Encounter (Signed)
Working on [employer] for [employer] 145 mcg, waiting on pt to call back to answer a few questions.

## 2018-08-07 DIAGNOSIS — M7541 Impingement syndrome of right shoulder: Secondary | ICD-10-CM | POA: Diagnosis not present

## 2018-10-07 NOTE — Telephone Encounter (Signed)
PA for Linzess was denied. LM with spouse and asked him to have pt call me. Waiting to see if pt wants to try a different medication due to the price being too expensive to pay out of pocket each month.

## 2018-11-04 DIAGNOSIS — Z1389 Encounter for screening for other disorder: Secondary | ICD-10-CM | POA: Diagnosis not present

## 2018-11-04 DIAGNOSIS — Z0001 Encounter for general adult medical examination with abnormal findings: Secondary | ICD-10-CM | POA: Diagnosis not present

## 2018-11-04 DIAGNOSIS — Z6831 Body mass index (BMI) 31.0-31.9, adult: Secondary | ICD-10-CM | POA: Diagnosis not present

## 2018-11-04 DIAGNOSIS — I10 Essential (primary) hypertension: Secondary | ICD-10-CM | POA: Diagnosis not present

## 2018-11-04 DIAGNOSIS — E063 Autoimmune thyroiditis: Secondary | ICD-10-CM | POA: Diagnosis not present

## 2018-11-04 DIAGNOSIS — E669 Obesity, unspecified: Secondary | ICD-10-CM | POA: Diagnosis not present

## 2018-11-04 DIAGNOSIS — K219 Gastro-esophageal reflux disease without esophagitis: Secondary | ICD-10-CM | POA: Diagnosis not present

## 2018-12-09 ENCOUNTER — Encounter: Payer: Self-pay | Admitting: Gastroenterology

## 2018-12-09 ENCOUNTER — Ambulatory Visit (INDEPENDENT_AMBULATORY_CARE_PROVIDER_SITE_OTHER): Payer: BLUE CROSS/BLUE SHIELD | Admitting: Gastroenterology

## 2018-12-09 ENCOUNTER — Other Ambulatory Visit: Payer: Self-pay

## 2018-12-09 DIAGNOSIS — K5900 Constipation, unspecified: Secondary | ICD-10-CM | POA: Diagnosis not present

## 2018-12-09 DIAGNOSIS — K219 Gastro-esophageal reflux disease without esophagitis: Secondary | ICD-10-CM

## 2018-12-09 MED ORDER — PANTOPRAZOLE SODIUM 20 MG PO TBEC: 20.0000 mg | DELAYED_RELEASE_TABLET | Freq: Every day | ORAL | 3 refills | Status: AC

## 2018-12-09 NOTE — Patient Instructions (Addendum)
Continue Protonix 20mg  daily as needed for heartburn. I have sent in a refill to your pharmacy.  Follow up in 1 year.   Call if you have any concerns before your next visit!  It was great talking with you today. Glad you are feeling much better!  Ermalinda Memos, PA-C [facility]

## 2018-12-09 NOTE — Progress Notes (Signed)
Referring Provider: Dr. Sherwood Gambler Primary Care Physician:  Elfredia Nevins, MD  Primary GI: Dr. Jena Gauss  Patient Location: Home   Provider Location: [facility] office   Reason for Visit: Follow-up Constipation   Persons present on the virtual encounter, with roles: Ermalinda Memos, PA-C (provider); Wendy Estrada (patient)   Total time (minutes) spent on medical discussion: 15 minutes   Due to COVID-19, visit was conducted using virtual method.  Visit was requested by patient.  Virtual Visit via Telephone Note Due to COVID-19, visit is conducted virtually and was requested by patient.   I connected with Wendy Estrada on 12/09/18 at 10:30 AM EDT by telephone and verified that I am speaking with the correct person using two identifiers.   I discussed the limitations, risks, security and privacy concerns of performing an evaluation and management service by telephone and the availability of in person appointments. I also discussed with the patient that there may be a patient responsible charge related to this service. The patient expressed understanding and agreed to proceed.  Chief Complaint  Patient presents with  . Follow-up    F/U 6 mo Constipation     History of Present Illness:  Wendy Estrada is a 63 y.o. female presenting today for follow-up.   Last seen in the office on 03/19/18 to schedule a screening colonoscopy. At this time she was also experiencing a change in bowel habits since March 2019 with diarrhea initially that turned to constipation. Was using MiraLAX twice per week and requested prescription medication for better management. She was prescribed Linzess 145 mcg daily and planned for colonoscopy. Colonoscopy 04/22/2018 with no abnormalities noted and recommended surveillance in 10 years.   Today she states her constipation has resolved. Not taking Linzess or any OTC medications. BM back baseline. Formed BM 2-3 times/day after meals. No diarrhea, no abdominal pain. No  blood in the stool or melena.    Occasional heartburn with certain foods including spicy foods, cabbage, and lettuce, but tries to stay away from these foods. Symptoms occur maybe 1x/month for which she will take Protonix 20mg  and symptoms resolve. Otherwise, no symptoms. No N/V or dysphagia. No abdominal pain. No use of NSAIDs.   Past Medical History:  Diagnosis Date  . Hypertension   . Thyroid disease    hypothyroidism     Past Surgical History:  Procedure Laterality Date  . ABDOMINAL HYSTERECTOMY    . APPENDECTOMY    . BREAST BIOPSY Right    negative  . CHOLECYSTECTOMY    . COLONOSCOPY  2010   Dr. Jena Gauss: Terminal ileum normal.  Single benign rectal polyp removed.  . COLONOSCOPY N/A 04/22/2018   Normal exam, screening due in 2029     Current Meds  Medication Sig  . amLODipine (NORVASC) 5 MG tablet Take 5 mg by mouth daily.  . hydrochlorothiazide (HYDRODIURIL) 25 MG tablet Take 25 mg by mouth daily.  Marland Kitchen levothyroxine (SYNTHROID, LEVOTHROID) 25 MCG tablet Take 25 mcg by mouth daily before breakfast.  . pantoprazole (PROTONIX) 20 MG tablet Take 1 tablet (20 mg total) by mouth daily before breakfast.     Family History  Problem Relation Age of Onset  . Hypothyroidism Mother   . Colon cancer Neg Hx   . Colon polyps Neg Hx     Social History   Socioeconomic History  . Marital status: Married    Spouse name: Not on file  . Number of children: Not on file  . Years of education:  Not on file  . Highest education level: Not on file  Occupational History  . Not on file  Social Needs  . Financial resource strain: Not on file  . Food insecurity:    Worry: Not on file    Inability: Not on file  . Transportation needs:    Medical: Not on file    Non-medical: Not on file  Tobacco Use  . Smoking status: Former Games developermoker  . Smokeless tobacco: Never Used  . Tobacco comment: quit smoking about 10 years ago  Substance and Sexual Activity  . Alcohol use: Yes    Comment:  nightly 4oz red wine  . Drug use: No  . Sexual activity: Yes  Lifestyle  . Physical activity:    Days per week: Not on file    Minutes per session: Not on file  . Stress: Not on file  Relationships  . Social connections:    Talks on phone: Not on file    Gets together: Not on file    Attends religious service: Not on file    Active member of club or organization: Not on file    Attends meetings of clubs or organizations: Not on file    Relationship status: Not on file  Other Topics Concern  . Not on file  Social History Narrative  . Not on file       Review of Systems: Gen: Denies fever, chills, weakness, and unintentional weight loss.  CV: Denies chest pain, palpitations Resp: Denies dyspnea at rest, cough GI: see HPI Psych: Denies depression, anxiety  Observations/Objective: Pleasant female. No distress. Unable to perform physical exam due to telephone encounter. No video available.   Assessment and Plan: 63 y.o. female with history of change in bowel habits with constipation in September 2019, and occasional heartburn. Constipation now resolved. Bowel movements are formed and back to baseline without use of prescriptive or OTC medications. Colonoscopy on 04/22/18 was normal with surveillance recommended in 10 years. With no other lower GI symptoms, no diarrhea or alarm symptoms, no further management is needed. Patient will call if she begins to have trouble again with her bowels.   Occasional heartburn has improved since last visit with only having symptoms about once a month associated with dietary triggers for which she takes Protonix. This is well controlled without other GI symptoms or alarm symptoms. Continue Protonix 20 mg daily as needed.   Follow Up Instructions: Follow up in 1 year.    I discussed the assessment and treatment plan with the patient. The patient was provided an opportunity to ask questions and all were answered. The patient agreed with the plan and  demonstrated an understanding of the instructions.   The patient was advised to call back or seek an in-person evaluation if the symptoms worsen or if the condition fails to improve as anticipated.  I provided 15 minutes of non-face-to-face time during this encounter.  Ermalinda MemosKristen Shelonda Saxe, PA-C [facility]

## 2018-12-10 NOTE — Progress Notes (Signed)
CC'D TO PCP °

## 2018-12-25 ENCOUNTER — Other Ambulatory Visit: Payer: Self-pay

## 2018-12-25 ENCOUNTER — Other Ambulatory Visit: Payer: BLUE CROSS/BLUE SHIELD

## 2018-12-25 DIAGNOSIS — Z20822 Contact with and (suspected) exposure to covid-19: Secondary | ICD-10-CM

## 2018-12-25 DIAGNOSIS — R6889 Other general symptoms and signs: Secondary | ICD-10-CM | POA: Diagnosis not present

## 2018-12-29 ENCOUNTER — Other Ambulatory Visit (HOSPITAL_COMMUNITY): Payer: Self-pay | Admitting: Internal Medicine

## 2018-12-29 DIAGNOSIS — Z1231 Encounter for screening mammogram for malignant neoplasm of breast: Secondary | ICD-10-CM

## 2018-12-31 LAB — NOVEL CORONAVIRUS, NAA: SARS-CoV-2, NAA: NOT DETECTED

## 2019-03-25 DIAGNOSIS — Z6831 Body mass index (BMI) 31.0-31.9, adult: Secondary | ICD-10-CM | POA: Diagnosis not present

## 2019-03-25 DIAGNOSIS — I10 Essential (primary) hypertension: Secondary | ICD-10-CM | POA: Diagnosis not present

## 2019-03-25 DIAGNOSIS — E6609 Other obesity due to excess calories: Secondary | ICD-10-CM | POA: Diagnosis not present

## 2019-03-25 DIAGNOSIS — E7849 Other hyperlipidemia: Secondary | ICD-10-CM | POA: Diagnosis not present

## 2019-03-25 DIAGNOSIS — Z23 Encounter for immunization: Secondary | ICD-10-CM | POA: Diagnosis not present

## 2019-06-14 DIAGNOSIS — Z6831 Body mass index (BMI) 31.0-31.9, adult: Secondary | ICD-10-CM | POA: Diagnosis not present

## 2019-06-14 DIAGNOSIS — I10 Essential (primary) hypertension: Secondary | ICD-10-CM | POA: Diagnosis not present

## 2019-06-14 DIAGNOSIS — Z23 Encounter for immunization: Secondary | ICD-10-CM | POA: Diagnosis not present

## 2019-06-14 DIAGNOSIS — E7849 Other hyperlipidemia: Secondary | ICD-10-CM | POA: Diagnosis not present

## 2019-09-08 ENCOUNTER — Ambulatory Visit
Admission: EM | Admit: 2019-09-08 | Discharge: 2019-09-08 | Disposition: A | Discharge status: Home / Self Care | Payer: BC Managed Care – PPO | Attending: Emergency Medicine | Admitting: Emergency Medicine

## 2019-09-08 ENCOUNTER — Other Ambulatory Visit: Payer: Self-pay

## 2019-09-08 DIAGNOSIS — H6123 Impacted cerumen, bilateral: Secondary | ICD-10-CM

## 2019-09-08 DIAGNOSIS — H9193 Unspecified hearing loss, bilateral: Secondary | ICD-10-CM

## 2019-09-08 NOTE — ED Provider Notes (Signed)
Wendy Estrada   [id number] 09/08/19 Arrival Time: 1828  CC: Muffled hearing  SUBJECTIVE: History from: patient.  Wendy Estrada is a 64 y.o. female who presents with of muffled hearing x 2 weeks.  States she has to wear ear plugs for work.  Denies pain. Denies aggravating or alleviating factors.  Reports similar symptoms in the past that improved with ear lavage.  Denies fever, chills, ear discharge, ear pain, fatigue, sinus pain, rhinorrhea, sore throat, SOB, chest pain, nausea, changes in bowel or bladder habits.    ROS: As per HPI.  All other pertinent ROS negative.     Past Medical History:  Diagnosis Date  . Hypertension   . Thyroid disease    hypothyroidism   Past Surgical History:  Procedure Laterality Date  . ABDOMINAL HYSTERECTOMY    . APPENDECTOMY    . BREAST BIOPSY Right    negative  . CHOLECYSTECTOMY    . COLONOSCOPY  2010   Dr. Gala Romney: Terminal ileum normal.  Single benign rectal polyp removed.  . COLONOSCOPY N/A 04/22/2018   Normal exam, screening due in 2029   Allergies  Allergen Reactions  . Codeine     nausea   No current facility-administered medications on file prior to encounter.   Current Outpatient Medications on File Prior to Encounter  Medication Sig Dispense Refill  . amLODipine (NORVASC) 5 MG tablet Take 5 mg by mouth daily.  11  . hydrochlorothiazide (HYDRODIURIL) 25 MG tablet Take 25 mg by mouth daily.    Marland Kitchen levothyroxine (SYNTHROID, LEVOTHROID) 25 MCG tablet Take 25 mcg by mouth daily before breakfast.    . pantoprazole (PROTONIX) 20 MG tablet Take 1 tablet (20 mg total) by mouth daily before breakfast. 90 tablet 3   Social History   Socioeconomic History  . Marital status: Married    Spouse name: Not on file  . Number of children: Not on file  . Years of education: Not on file  . Highest education level: Not on file  Occupational History  . Not on file  Tobacco Use  . Smoking status: Former Research scientist (life sciences)  . Smokeless tobacco:  Never Used  . Tobacco comment: quit smoking about 10 years ago  Substance and Sexual Activity  . Alcohol use: Yes    Comment: nightly 4oz red wine  . Drug use: No  . Sexual activity: Yes  Other Topics Concern  . Not on file  Social History Narrative  . Not on file   Social Determinants of Health   Financial Resource Strain:   . Difficulty of Paying Living Expenses: Not on file  Food Insecurity:   . Worried About Charity fundraiser in the Last Year: Not on file  . Ran Out of Food in the Last Year: Not on file  Transportation Needs:   . Lack of Transportation (Medical): Not on file  . Lack of Transportation (Non-Medical): Not on file  Physical Activity:   . Days of Exercise per Week: Not on file  . Minutes of Exercise per Session: Not on file  Stress:   . Feeling of Stress : Not on file  Social Connections:   . Frequency of Communication with Friends and Family: Not on file  . Frequency of Social Gatherings with Friends and Family: Not on file  . Attends Religious Services: Not on file  . Active Member of Clubs or Organizations: Not on file  . Attends Archivist Meetings: Not on file  . Marital Status: Not  on file  Intimate Partner Violence:   . Fear of Current or Ex-Partner: Not on file  . Emotionally Abused: Not on file  . Physically Abused: Not on file  . Sexually Abused: Not on file   Family History  Problem Relation Age of Onset  . Hypothyroidism Mother   . Healthy Father   . Colon cancer Neg Hx   . Colon polyps Neg Hx     OBJECTIVE:  Vitals:   09/08/19 1836  BP: (!) 144/85  Pulse: 61  Resp: 16  Temp: 98.1 F (36.7 C)  TempSrc: Oral  SpO2: 98%     General appearance: alert; well-appearing, nontoxic HEENT: Ears: EACs obstructed with cerumen; Eyes: PERRL, EOMI grossly; Sinuses nontender to palpation; Nose: patent without rhinorrhea; Throat: oropharynx clear, tonsils not enlarged or erythematous without white tonsillar exudates, uvula midline  Neck: supple without LAD Lungs: unlabored respirations, symmetrical air entry; cough: absent; no respiratory distress Heart: regular rate and rhythm.   Skin: warm and dry Psychological: alert and cooperative; normal mood and affect  PROCEDURE: Consent granted.  Bilateral ear lavage performed by RN.  TM visualized.  Pearly gray with visible cone of light.  PT tolerated procedure well.    ASSESSMENT & PLAN:  1. Bilateral impacted cerumen   2. Decreased hearing of both ears    Ear lavage performed Continue to use OTC ibuprofen and/ or tylenol as needed for pain control Follow up with PCP if symptoms persists Return here or go to the ER if you have any new or worsening symptoms fever, chills, nausea, vomiting, chest pain, shortness of breath, ear pain, discharge from ear, blood from ears, changes in hearing, etc...  Reviewed expectations re: course of current medical issues. Questions answered. Outlined signs and symptoms indicating need for more acute intervention. Patient verbalized understanding. After Visit Summary given.         Rennis Harding, PA-C 09/08/19 1902

## 2019-09-08 NOTE — Discharge Instructions (Signed)
Ear lavage performed Continue to use OTC ibuprofen and/ or tylenol as needed for pain control Follow up with PCP if symptoms persists Return here or go to the ER if you have any new or worsening symptoms fever, chills, nausea, vomiting, chest pain, shortness of breath, ear pain, discharge from ear, blood from ears, changes in hearing, etc..Marland Kitchen

## 2019-09-08 NOTE — ED Triage Notes (Signed)
Pt presents to UC w/ c/o muffled hearing x2 weeks. Pt states she has to have her ears irrigated occasionally.

## 2019-10-28 ENCOUNTER — Encounter: Payer: Self-pay | Admitting: Internal Medicine

## 2019-11-03 DIAGNOSIS — E6609 Other obesity due to excess calories: Secondary | ICD-10-CM | POA: Diagnosis not present

## 2019-11-03 DIAGNOSIS — Z1389 Encounter for screening for other disorder: Secondary | ICD-10-CM | POA: Diagnosis not present

## 2019-11-03 DIAGNOSIS — Z6830 Body mass index (BMI) 30.0-30.9, adult: Secondary | ICD-10-CM | POA: Diagnosis not present

## 2019-11-03 DIAGNOSIS — E063 Autoimmune thyroiditis: Secondary | ICD-10-CM | POA: Diagnosis not present

## 2019-11-03 DIAGNOSIS — I10 Essential (primary) hypertension: Secondary | ICD-10-CM | POA: Diagnosis not present

## 2020-06-13 DIAGNOSIS — Z1331 Encounter for screening for depression: Secondary | ICD-10-CM | POA: Diagnosis not present

## 2020-06-13 DIAGNOSIS — K219 Gastro-esophageal reflux disease without esophagitis: Secondary | ICD-10-CM | POA: Diagnosis not present

## 2020-06-13 DIAGNOSIS — Z6830 Body mass index (BMI) 30.0-30.9, adult: Secondary | ICD-10-CM | POA: Diagnosis not present

## 2020-06-13 DIAGNOSIS — E063 Autoimmune thyroiditis: Secondary | ICD-10-CM | POA: Diagnosis not present

## 2020-06-13 DIAGNOSIS — Z0001 Encounter for general adult medical examination with abnormal findings: Secondary | ICD-10-CM | POA: Diagnosis not present

## 2020-06-13 DIAGNOSIS — H6123 Impacted cerumen, bilateral: Secondary | ICD-10-CM | POA: Diagnosis not present

## 2020-06-13 DIAGNOSIS — I10 Essential (primary) hypertension: Secondary | ICD-10-CM | POA: Diagnosis not present

## 2020-06-23 DIAGNOSIS — Z6830 Body mass index (BMI) 30.0-30.9, adult: Secondary | ICD-10-CM | POA: Diagnosis not present

## 2020-06-23 DIAGNOSIS — E6609 Other obesity due to excess calories: Secondary | ICD-10-CM | POA: Diagnosis not present

## 2020-08-14 DIAGNOSIS — Z1231 Encounter for screening mammogram for malignant neoplasm of breast: Secondary | ICD-10-CM | POA: Diagnosis not present

## 2020-09-26 ENCOUNTER — Encounter: Payer: Self-pay | Admitting: *Deleted

## 2020-11-15 DIAGNOSIS — S0990XA Unspecified injury of head, initial encounter: Secondary | ICD-10-CM | POA: Diagnosis not present

## 2020-11-15 DIAGNOSIS — Z6829 Body mass index (BMI) 29.0-29.9, adult: Secondary | ICD-10-CM | POA: Diagnosis not present

## 2020-11-15 DIAGNOSIS — E663 Overweight: Secondary | ICD-10-CM | POA: Diagnosis not present

## 2020-12-21 DIAGNOSIS — H6123 Impacted cerumen, bilateral: Secondary | ICD-10-CM | POA: Diagnosis not present

## 2020-12-21 DIAGNOSIS — Z23 Encounter for immunization: Secondary | ICD-10-CM | POA: Diagnosis not present

## 2020-12-21 DIAGNOSIS — E663 Overweight: Secondary | ICD-10-CM | POA: Diagnosis not present

## 2020-12-21 DIAGNOSIS — I10 Essential (primary) hypertension: Secondary | ICD-10-CM | POA: Diagnosis not present

## 2020-12-21 DIAGNOSIS — Z6829 Body mass index (BMI) 29.0-29.9, adult: Secondary | ICD-10-CM | POA: Diagnosis not present

## 2021-04-09 ENCOUNTER — Telehealth: Payer: Self-pay | Admitting: *Deleted

## 2021-04-09 NOTE — Telephone Encounter (Signed)
Per Rubye Oaks NP: Change CPAP to 5-15 auto OV in 1 month.  ATC patient x1.  Left message with husband to have her return call.  He states she works 12 hours and does not get off until 6 pm.  When she returns call please schedule her for a f/u in 1 month and find out what DME company she uses.

## 2021-07-18 DIAGNOSIS — E559 Vitamin D deficiency, unspecified: Secondary | ICD-10-CM | POA: Diagnosis not present

## 2021-07-18 DIAGNOSIS — E063 Autoimmune thyroiditis: Secondary | ICD-10-CM | POA: Diagnosis not present

## 2021-07-18 DIAGNOSIS — Z0001 Encounter for general adult medical examination with abnormal findings: Secondary | ICD-10-CM | POA: Diagnosis not present

## 2021-07-18 DIAGNOSIS — Z23 Encounter for immunization: Secondary | ICD-10-CM | POA: Diagnosis not present

## 2021-07-18 DIAGNOSIS — E538 Deficiency of other specified B group vitamins: Secondary | ICD-10-CM | POA: Diagnosis not present

## 2021-07-18 DIAGNOSIS — Z1331 Encounter for screening for depression: Secondary | ICD-10-CM | POA: Diagnosis not present

## 2021-07-18 DIAGNOSIS — E669 Obesity, unspecified: Secondary | ICD-10-CM | POA: Diagnosis not present

## 2021-07-18 DIAGNOSIS — Z6829 Body mass index (BMI) 29.0-29.9, adult: Secondary | ICD-10-CM | POA: Diagnosis not present

## 2021-07-18 DIAGNOSIS — I10 Essential (primary) hypertension: Secondary | ICD-10-CM | POA: Diagnosis not present

## 2021-07-18 DIAGNOSIS — E782 Mixed hyperlipidemia: Secondary | ICD-10-CM | POA: Diagnosis not present

## 2021-08-07 DIAGNOSIS — R7309 Other abnormal glucose: Secondary | ICD-10-CM | POA: Diagnosis not present

## 2021-10-12 ENCOUNTER — Other Ambulatory Visit (HOSPITAL_COMMUNITY): Payer: Self-pay | Admitting: Internal Medicine

## 2021-10-12 DIAGNOSIS — Z1231 Encounter for screening mammogram for malignant neoplasm of breast: Secondary | ICD-10-CM

## 2021-10-18 ENCOUNTER — Ambulatory Visit (HOSPITAL_COMMUNITY)
Admission: RE | Admit: 2021-10-18 | Discharge: 2021-10-18 | Disposition: A | Discharge status: Home / Self Care | Payer: BC Managed Care – PPO | Source: Ambulatory Visit | Attending: Internal Medicine | Admitting: Internal Medicine

## 2021-10-18 DIAGNOSIS — Z1231 Encounter for screening mammogram for malignant neoplasm of breast: Secondary | ICD-10-CM | POA: Insufficient documentation

## 2022-02-10 ENCOUNTER — Ambulatory Visit
Admission: EM | Admit: 2022-02-10 | Discharge: 2022-02-10 | Disposition: A | Discharge status: Home / Self Care | Payer: BC Managed Care – PPO

## 2022-02-10 DIAGNOSIS — H6123 Impacted cerumen, bilateral: Secondary | ICD-10-CM

## 2022-02-10 MED ORDER — CARBAMIDE PEROXIDE 6.5 % OT SOLN: 5.0000 [drp] | Freq: Two times a day (BID) | OTIC | 0 refills | Status: DC

## 2022-02-10 NOTE — ED Provider Notes (Signed)
[facility]   MRN: [id number] DOB: 1955-08-12  Subjective:   Wendy Estrada is a 66 y.o. female presenting for 1 week history of persistent left ear fullness and pain.  Has also had decreased hearing.  Patient reports a history of cerumen impaction because she has to use earplugs for her work.  No fever, tinnitus, drainage, runny or stuffy nose or throat pain.  No current facility-administered medications for this encounter.  Current Outpatient Medications:    amLODipine (NORVASC) 5 MG tablet, Take 5 mg by mouth daily., Disp: , Rfl: 11   hydrochlorothiazide (HYDRODIURIL) 25 MG tablet, Take 25 mg by mouth daily., Disp: , Rfl:    levothyroxine (SYNTHROID, LEVOTHROID) 25 MCG tablet, Take 25 mcg by mouth daily before breakfast., Disp: , Rfl:    rosuvastatin (CRESTOR) 10 MG tablet, Take 10 mg by mouth daily., Disp: , Rfl:    pantoprazole (PROTONIX) 20 MG tablet, Take 1 tablet (20 mg total) by mouth daily before breakfast., Disp: 90 tablet, Rfl: 3   Allergies  Allergen Reactions   Codeine     nausea    Past Medical History:  Diagnosis Date   Hypertension    Thyroid disease    hypothyroidism     Past Surgical History:  Procedure Laterality Date   ABDOMINAL HYSTERECTOMY     APPENDECTOMY     BREAST BIOPSY Right    negative   CHOLECYSTECTOMY     COLONOSCOPY  2010   Dr. Jena Gauss: Terminal ileum normal.  Single benign rectal polyp removed.   COLONOSCOPY N/A 04/22/2018   Normal exam, screening due in 2029    Family History  Problem Relation Age of Onset   Hypothyroidism Mother    Healthy Father    Cancer Sister    Colon cancer Neg Hx    Colon polyps Neg Hx     Social History   Tobacco Use   Smoking status: Former   Smokeless tobacco: Never   Tobacco comments:    quit smoking about 10 years ago  Substance Use Topics   Alcohol use: Not Currently    Comment: nightly 4oz red wine   Drug use: No    ROS   Objective:   Vitals: BP (!)  167/85 (BP Location: Right Arm)   Pulse 63   Temp 98.6 F (37 C) (Oral)   Resp 18   Ht 5\' 5"  (1.651 m)   Wt 186 lb (84.4 kg)   SpO2 96%   BMI 30.95 kg/m   Physical Exam Constitutional:      General: She is not in acute distress.    Appearance: Normal appearance. She is well-developed. She is not ill-appearing, toxic-appearing or diaphoretic.  HENT:     Head: Normocephalic and atraumatic.     Right Ear: Tympanic membrane, ear canal and external ear normal. No tenderness. There is impacted cerumen. Tympanic membrane is not injected, perforated, erythematous or bulging.     Left Ear: Tympanic membrane, ear canal and external ear normal. No tenderness. There is impacted cerumen. Tympanic membrane is not injected, perforated, erythematous or bulging.     Nose: Nose normal.     Mouth/Throat:     Mouth: Mucous membranes are moist.  Eyes:     General: No scleral icterus.       Right eye: No discharge.        Left eye: No discharge.     Extraocular Movements: Extraocular movements intact.  Cardiovascular:  Rate and Rhythm: Normal rate.  Pulmonary:     Effort: Pulmonary effort is normal.  Skin:    General: Skin is warm and dry.  Neurological:     General: No focal deficit present.     Mental Status: She is alert and oriented to person, place, and time.  Psychiatric:        Mood and Affect: Mood normal.        Behavior: Behavior normal.    Ear lavage performed using mixture of peroxide and water.  Pressure irrigation performed using a bottle and a thin ear tube.  Bilateral ear lavage.  Curette was used for manual removal.  Assessment and Plan :   PDMP not reviewed this encounter.  1. Bilateral impacted cerumen    Successful bilateral ear lavage.  General management of cerumen impaction reviewed with patient.  Anticipatory guidance provided. Counseled patient on potential for adverse effects with medications prescribed/recommended today, ER and return-to-clinic precautions  discussed, patient verbalized understanding.    Wallis Bamberg, [place] 02/10/22 [phone]

## 2022-02-10 NOTE — ED Triage Notes (Signed)
Pt states that she has some ear fullness and ear pain. X1 week

## 2022-05-27 DIAGNOSIS — E6609 Other obesity due to excess calories: Secondary | ICD-10-CM | POA: Diagnosis not present

## 2022-05-27 DIAGNOSIS — Z23 Encounter for immunization: Secondary | ICD-10-CM | POA: Diagnosis not present

## 2022-05-27 DIAGNOSIS — E063 Autoimmune thyroiditis: Secondary | ICD-10-CM | POA: Diagnosis not present

## 2022-05-27 DIAGNOSIS — I10 Essential (primary) hypertension: Secondary | ICD-10-CM | POA: Diagnosis not present

## 2022-05-27 DIAGNOSIS — E559 Vitamin D deficiency, unspecified: Secondary | ICD-10-CM | POA: Diagnosis not present

## 2022-05-27 DIAGNOSIS — R7309 Other abnormal glucose: Secondary | ICD-10-CM | POA: Diagnosis not present

## 2022-05-27 DIAGNOSIS — Z6831 Body mass index (BMI) 31.0-31.9, adult: Secondary | ICD-10-CM | POA: Diagnosis not present

## 2022-05-27 DIAGNOSIS — E538 Deficiency of other specified B group vitamins: Secondary | ICD-10-CM | POA: Diagnosis not present

## 2022-06-03 DIAGNOSIS — E669 Obesity, unspecified: Secondary | ICD-10-CM | POA: Diagnosis not present

## 2022-06-03 DIAGNOSIS — Z6831 Body mass index (BMI) 31.0-31.9, adult: Secondary | ICD-10-CM | POA: Diagnosis not present

## 2022-06-03 DIAGNOSIS — R030 Elevated blood-pressure reading, without diagnosis of hypertension: Secondary | ICD-10-CM | POA: Diagnosis not present

## 2022-06-03 DIAGNOSIS — M25572 Pain in left ankle and joints of left foot: Secondary | ICD-10-CM | POA: Diagnosis not present

## 2022-07-14 DIAGNOSIS — E669 Obesity, unspecified: Secondary | ICD-10-CM | POA: Diagnosis not present

## 2022-07-14 DIAGNOSIS — M25572 Pain in left ankle and joints of left foot: Secondary | ICD-10-CM | POA: Diagnosis not present

## 2022-07-14 DIAGNOSIS — Z6831 Body mass index (BMI) 31.0-31.9, adult: Secondary | ICD-10-CM | POA: Diagnosis not present

## 2022-07-14 DIAGNOSIS — R030 Elevated blood-pressure reading, without diagnosis of hypertension: Secondary | ICD-10-CM | POA: Diagnosis not present

## 2022-07-24 ENCOUNTER — Ambulatory Visit: Payer: BC Managed Care – PPO | Admitting: Orthopaedic Surgery

## 2022-07-24 ENCOUNTER — Ambulatory Visit (INDEPENDENT_AMBULATORY_CARE_PROVIDER_SITE_OTHER): Payer: BC Managed Care – PPO

## 2022-07-24 ENCOUNTER — Encounter: Payer: Self-pay | Admitting: Orthopaedic Surgery

## 2022-07-24 VITALS — BP 156/10 | HR 68 | Ht 65.0 in | Wt 192.0 lb

## 2022-07-24 DIAGNOSIS — M25572 Pain in left ankle and joints of left foot: Secondary | ICD-10-CM | POA: Diagnosis not present

## 2022-07-24 MED ORDER — NAPROXEN 500 MG PO TABS: 500.0000 mg | ORAL_TABLET | Freq: Two times a day (BID) | ORAL | 5 refills | Status: DC

## 2022-07-24 NOTE — Progress Notes (Signed)
Subjective:    Patient ID: Wendy Estrada, female    DOB: 01/06/1956, 67 y.o.   MRN: [id number]  HPI She hurt her left ankle on 06-06-22.  She was seen at [facility].  X-rays were done.  I have reviewed the notes.  She has had persistent swelling of the ankle on the left with more pain medially and more swelling laterally.  She has used ice, elevation, ibuprofen and rest and the pain continues.  She is concerned that the swelling is still there.  She has no new trauma, no redness, no weakness.  She has no other joint pains.   Review of Systems  Constitutional:  Positive for activity change.  Musculoskeletal:  Positive for arthralgias, gait problem and joint swelling.  All other systems reviewed and are negative. For Review of Systems, all other systems reviewed and are negative.  The following is a summary of the past history medically, past history surgically, known current medicines, social history and family history.  This information is gathered electronically by the computer from prior information and documentation.  I review this each visit and have found including this information at this point in the chart is beneficial and informative.   Past Medical History:  Diagnosis Date   Hypertension    Thyroid disease    hypothyroidism    Past Surgical History:  Procedure Laterality Date   ABDOMINAL HYSTERECTOMY     APPENDECTOMY     BREAST BIOPSY Right    negative   CHOLECYSTECTOMY     COLONOSCOPY  2010   Dr. Gala Romney: Terminal ileum normal.  Single benign rectal polyp removed.   COLONOSCOPY N/A 04/22/2018   Normal exam, screening due in 2029    Current Outpatient Medications on File Prior to Visit  Medication Sig Dispense Refill   amLODipine (NORVASC) 5 MG tablet Take 5 mg by mouth daily.  11   carbamide peroxide (DEBROX) 6.5 % OTIC solution Place 5 drops into both ears 2 (two) times daily. 15 mL 0   hydrochlorothiazide (HYDRODIURIL) 25 MG tablet Take 25 mg by mouth  daily.     levothyroxine (SYNTHROID, LEVOTHROID) 25 MCG tablet Take 25 mcg by mouth daily before breakfast.     pantoprazole (PROTONIX) 20 MG tablet Take 1 tablet (20 mg total) by mouth daily before breakfast. 90 tablet 3   rosuvastatin (CRESTOR) 10 MG tablet Take 10 mg by mouth daily.     No current facility-administered medications on file prior to visit.    Social History   Socioeconomic History   Marital status: Married    Spouse name: Not on file   Number of children: Not on file   Years of education: Not on file   Highest education level: Not on file  Occupational History   Not on file  Tobacco Use   Smoking status: Former   Smokeless tobacco: Never   Tobacco comments:    quit smoking about 10 years ago  Substance and Sexual Activity   Alcohol use: Not Currently    Comment: nightly 4oz red wine   Drug use: No   Sexual activity: Yes  Other Topics Concern   Not on file  Social History Narrative   Not on file   Social Determinants of Health   Financial Resource Strain: Not on file  Food Insecurity: Not on file  Transportation Needs: Not on file  Physical Activity: Not on file  Stress: Not on file  Social Connections: Not on file  Intimate  Partner Violence: Not on file    Family History  Problem Relation Age of Onset   Hypothyroidism Mother    Healthy Father    Cancer Sister    Colon cancer Neg Hx    Colon polyps Neg Hx     BP (!) 156/10   Pulse 68   Ht 5\' 5"  (1.651 m)   Wt 192 lb (87.1 kg)   BMI 31.95 kg/m   Body mass index is 31.95 kg/m.      Objective:   Physical Exam Vitals and nursing note reviewed. Exam conducted with a chaperone present.  Constitutional:      Appearance: She is well-developed.  HENT:     Head: Normocephalic and atraumatic.  Eyes:     Conjunctiva/sclera: Conjunctivae normal.     Pupils: Pupils are equal, round, and reactive to light.  Cardiovascular:     Rate and Rhythm: Normal rate and regular rhythm.  Pulmonary:      Effort: Pulmonary effort is normal.  Abdominal:     Palpations: Abdomen is soft.  Musculoskeletal:     Cervical back: Normal range of motion and neck supple.       Feet:  Skin:    General: Skin is warm and dry.  Neurological:     Mental Status: She is alert and oriented to person, place, and time.     Cranial Nerves: No cranial nerve deficit.     Motor: No abnormal muscle tone.     Coordination: Coordination normal.     Deep Tendon Reflexes: Reflexes are normal and symmetric. Reflexes normal.  Psychiatric:        Behavior: Behavior normal.        Thought Content: Thought content normal.        Judgment: Judgment normal.   X-rays were done of the left foot, reported separately.        Assessment & Plan:   Encounter Diagnosis  Name Primary?   Pain in left ankle and joints of left foot Yes   I will begin contrast baths.  I have given instructions for her.  I will begin ankle lace up brace, provided, with instructions on use.  I will begin Naprosyn 500 po bid pc.  Return in two weeks.  She may need MRI as she has not improved in over six weeks.  Call if any problem.  Precautions discussed.  Electronically Signed Sanjuana Kava, MD 1/10/20249:24 AM

## 2022-07-24 NOTE — Patient Instructions (Signed)
Contrast baths are excellent for joints of the body which have been sprained or strained and have swelling. 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  Contrast baths consist of placing a foot or hand into a basin of warm (not hot) water for a short period of time and then taking the extremity and moving it immediately into a solution of ice water. It is kept in the ice cold water for a short period of time, as long as the patient can tolerate, then moved back into the warm solution.  The hand or foot is then removed and put back in the cold solution and kept there as long as possible and then removed and no further soaks are done.   When you first start doing this you will experience the "pins and needle" effect putting your hand in the cold solution. You may only tolerate the ice cold solution for a period of 5 to 30 seconds. You will not like this at first. It will hurt. Please be patient and attempt it as best as you can.   The goal is to place the extremity in the cold water for 5 minutes then place in warm water for 5 min. Repeating until 30 minutes have passed. (that's 3 times in each)  In a few days you will enjoy having your foot or hand in the cold water. As the treatment progresses, you will then find the pins and needle effect taking place after you have had your extremity in the cold solution and then placing it in the warm. It will then be the warm solution that you will dread.  You should notice decreased swelling in your extremity.   After finishing the contrast baths, you need to elevate your extremity for 10 to 15 minutes. For the ankle you will have your toes higher than your nose, and for the hand you will need your hand above the nose as well.   You will need to continue this for 7-10 days unless otherwise directed.    If you have any questions, please contact our office during office hours.

## 2022-07-25 ENCOUNTER — Ambulatory Visit: Payer: BC Managed Care – PPO | Admitting: Orthopaedic Surgery

## 2022-08-07 ENCOUNTER — Ambulatory Visit: Payer: BC Managed Care – PPO | Admitting: Orthopaedic Surgery

## 2022-08-07 ENCOUNTER — Ambulatory Visit (INDEPENDENT_AMBULATORY_CARE_PROVIDER_SITE_OTHER): Payer: BC Managed Care – PPO | Admitting: Orthopaedic Surgery

## 2022-08-07 ENCOUNTER — Encounter: Payer: Self-pay | Admitting: Orthopaedic Surgery

## 2022-08-07 VITALS — BP 150/91 | HR 69

## 2022-08-07 DIAGNOSIS — M25572 Pain in left ankle and joints of left foot: Secondary | ICD-10-CM | POA: Diagnosis not present

## 2022-08-07 NOTE — Progress Notes (Signed)
I am better.  She has been using the ankle brace and taking the Naprosyn plus the contrast baths.  She has less pain and less swelling.  She has no new trauma.  She still has some swelling laterally and tenderness over the anterior talofibular ligament but much less swelling than last time.  ROM is full.  NV intact.  Ankle is stable.  Encounter Diagnosis  Name Primary?   Pain in left ankle and joints of left foot Yes   Continue as she is doing but gradually come out of the brace.  Return in two weeks.  If not better, consider MRI.  Call if any problem.  Precautions discussed.  Electronically Signed Sanjuana Kava, MD 1/24/20248:42 AM

## 2022-08-15 ENCOUNTER — Telehealth: Payer: Self-pay | Admitting: Orthopaedic Surgery

## 2022-08-15 NOTE — Telephone Encounter (Signed)
Patient called at 2:00 pm lvm saying she was at work and someone tried to call her 20 minutes ago.   I just tried to call the patient back and it went to voicemail.  Unable to leave message voicemail is full.

## 2022-08-20 ENCOUNTER — Ambulatory Visit (INDEPENDENT_AMBULATORY_CARE_PROVIDER_SITE_OTHER): Payer: BC Managed Care – PPO | Admitting: Orthopaedic Surgery

## 2022-08-20 ENCOUNTER — Encounter: Payer: Self-pay | Admitting: Orthopaedic Surgery

## 2022-08-20 VITALS — BP 161/74 | HR 67 | Ht 65.0 in | Wt 192.0 lb

## 2022-08-20 DIAGNOSIS — M25572 Pain in left ankle and joints of left foot: Secondary | ICD-10-CM

## 2022-08-20 NOTE — Patient Instructions (Signed)
Central Scheduling [phone]  While we are working on your approval please go ahead and call to schedule your appointment to be done within one week. If you can not get an appointment at [place] within the next week, ask if they have something sooner at [facility] or [place] if you are able to go to [place] to have the imaging done.  AFTER you have made your imaging appointment, please call our office back at [phone] to schedule an appointment to review your results.   

## 2022-08-20 NOTE — Progress Notes (Signed)
My ankle still hurts.  She has medial ankle pain that will not go away.  She has used the ankle brace and done the contrast baths.  She has no new trauma.  She has medial ankle pain, ROM is good.  Slight swelling laterally.  NV intact.  Limp left.  Encounter Diagnosis  Name Primary?   Pain in left ankle and joints of left foot Yes   I will get MRI of the left ankle.  Return after MRI.  Call if any problem.  Precautions discussed.  Electronically Signed Sanjuana Kava, MD 2/6/20243:00 PM

## 2022-09-09 ENCOUNTER — Telehealth: Payer: Self-pay | Admitting: Orthopaedic Surgery

## 2022-09-09 NOTE — Telephone Encounter (Signed)
Spoke with patient and advised her to contact central scheduling and discuss the payment. She stated she just got an email. Provided patient with the number and she will call to discuss.

## 2022-09-09 NOTE — Telephone Encounter (Signed)
Patient called, lvm stating that she is scheduled for a MRI tomorrow at [facility], but insurance told her she would need to pay $2100 up front and she doesn't have that.  She wants to know what to do now.  She has a MRI review appt for Thursday, advise if that needs to be canceled if no solution for MRI.  [phone]

## 2022-09-10 ENCOUNTER — Ambulatory Visit (HOSPITAL_COMMUNITY)
Admission: RE | Admit: 2022-09-10 | Discharge: 2022-09-10 | Disposition: A | Discharge status: Home / Self Care | Payer: BC Managed Care – PPO | Source: Ambulatory Visit | Attending: Orthopaedic Surgery | Admitting: Orthopaedic Surgery

## 2022-09-10 DIAGNOSIS — M25572 Pain in left ankle and joints of left foot: Secondary | ICD-10-CM | POA: Diagnosis not present

## 2022-09-12 ENCOUNTER — Encounter: Payer: Self-pay | Admitting: Orthopaedic Surgery

## 2022-09-12 ENCOUNTER — Ambulatory Visit (INDEPENDENT_AMBULATORY_CARE_PROVIDER_SITE_OTHER): Payer: BC Managed Care – PPO | Admitting: Orthopaedic Surgery

## 2022-09-12 DIAGNOSIS — M25572 Pain in left ankle and joints of left foot: Secondary | ICD-10-CM

## 2022-09-12 NOTE — Progress Notes (Signed)
My ankle is less tender  She had MRI of the left ankle showing: IMPRESSION: 1. Thickening and T2 hyperintensity within the deltoid and spring ligaments with probable reactive edema in the adjacent medial malleolus and medial aspect of the talus. These findings could be posttraumatic and/or secondary to synovitis. 2. The anterior talofibular ligament appears thickened, likely due to prior injury. No acute ligamentous findings. 3. The Achilles tendon and additional ankle tendons appear normal. 4. No suspected acute findings.  I have explained the findings.  She does not need surgery.  I have independently reviewed the MRI.    Her left ankle has no swelling today and gait is good.  ROM is full.  Encounter Diagnosis  Name Primary?   Pain in left ankle and joints of left foot Yes   Return in one month.  Call and cancel if doing well.  Call if any problem.  Precautions discussed.  Electronically Signed Sanjuana Kava, MD 2/29/20248:45 AM

## 2022-10-09 ENCOUNTER — Ambulatory Visit (INDEPENDENT_AMBULATORY_CARE_PROVIDER_SITE_OTHER): Payer: BC Managed Care – PPO | Admitting: Orthopaedic Surgery

## 2022-10-09 ENCOUNTER — Encounter: Payer: Self-pay | Admitting: Orthopaedic Surgery

## 2022-10-09 VITALS — BP 176/96 | HR 66

## 2022-10-09 DIAGNOSIS — M25572 Pain in left ankle and joints of left foot: Secondary | ICD-10-CM | POA: Diagnosis not present

## 2022-10-09 NOTE — Patient Instructions (Signed)
The irritation that showed on your MRI will slowly get better. It will take time but you will get there.  Follow up: As Needed  DR.KEELING'S SCHEDULE IS AS FOLLOWS: TUESDAY: ALL DAY WEDNESDAY: MORNING ONLY THURSDAY: MORNING ONLY PLEASE CALL OR SEND A MESSAGE VIA MYCHART BY WEDNESDAY MORNING SO THAT I CAN SEND A REQUEST TO HIM. HE LEAVES THURSDAY BY 11:30AM. HE DOES NOT RETURN TO THE OFFICE UNTIL TUESDAY MORNINGS AND HE DOES NOT CHECK HIS WORK MESSAGES DURING HIS TIME AWAY FROM THE OFFICE.   GIRL!!! GET A SMALLER BAG!!!!!

## 2022-10-09 NOTE — Progress Notes (Signed)
I am a little better.  Her left ankle pain is getting less painful.  She still has an area on the medial side that is tender but she is walking well and is doing well.  Left ankle has some tenderness over the medial deltoid area but has full ROM and no redness.  NV intact.  Gait is good.  Encounter Diagnosis  Name Primary?   Pain in left ankle and joints of left foot Yes   I will see prn.  Call if any problem.  Precautions discussed.  Electronically Signed Sanjuana Kava, MD 3/27/20248:57 AM

## 2022-10-10 ENCOUNTER — Ambulatory Visit: Payer: BC Managed Care – PPO | Admitting: Orthopaedic Surgery

## 2022-11-28 DIAGNOSIS — Z6830 Body mass index (BMI) 30.0-30.9, adult: Secondary | ICD-10-CM | POA: Diagnosis not present

## 2022-11-28 DIAGNOSIS — R12 Heartburn: Secondary | ICD-10-CM | POA: Diagnosis not present

## 2022-11-28 DIAGNOSIS — E782 Mixed hyperlipidemia: Secondary | ICD-10-CM | POA: Diagnosis not present

## 2022-11-28 DIAGNOSIS — I10 Essential (primary) hypertension: Secondary | ICD-10-CM | POA: Diagnosis not present

## 2022-11-28 DIAGNOSIS — R7309 Other abnormal glucose: Secondary | ICD-10-CM | POA: Diagnosis not present

## 2022-11-28 DIAGNOSIS — E6609 Other obesity due to excess calories: Secondary | ICD-10-CM | POA: Diagnosis not present

## 2022-11-28 DIAGNOSIS — E063 Autoimmune thyroiditis: Secondary | ICD-10-CM | POA: Diagnosis not present

## 2023-04-21 ENCOUNTER — Other Ambulatory Visit (HOSPITAL_COMMUNITY): Payer: Self-pay | Admitting: Family Medicine

## 2023-04-21 DIAGNOSIS — Z1331 Encounter for screening for depression: Secondary | ICD-10-CM | POA: Diagnosis not present

## 2023-04-21 DIAGNOSIS — Z1231 Encounter for screening mammogram for malignant neoplasm of breast: Secondary | ICD-10-CM

## 2023-04-21 DIAGNOSIS — R7309 Other abnormal glucose: Secondary | ICD-10-CM | POA: Diagnosis not present

## 2023-04-21 DIAGNOSIS — Z6830 Body mass index (BMI) 30.0-30.9, adult: Secondary | ICD-10-CM | POA: Diagnosis not present

## 2023-04-21 DIAGNOSIS — E6609 Other obesity due to excess calories: Secondary | ICD-10-CM | POA: Diagnosis not present

## 2023-04-21 DIAGNOSIS — Z0000 Encounter for general adult medical examination without abnormal findings: Secondary | ICD-10-CM | POA: Diagnosis not present

## 2023-04-21 DIAGNOSIS — Z23 Encounter for immunization: Secondary | ICD-10-CM | POA: Diagnosis not present

## 2023-06-07 DIAGNOSIS — S86912A Strain of unspecified muscle(s) and tendon(s) at lower leg level, left leg, initial encounter: Secondary | ICD-10-CM | POA: Diagnosis not present

## 2023-06-07 DIAGNOSIS — E669 Obesity, unspecified: Secondary | ICD-10-CM | POA: Diagnosis not present

## 2023-06-07 DIAGNOSIS — R030 Elevated blood-pressure reading, without diagnosis of hypertension: Secondary | ICD-10-CM | POA: Diagnosis not present

## 2023-06-07 DIAGNOSIS — Z6830 Body mass index (BMI) 30.0-30.9, adult: Secondary | ICD-10-CM | POA: Diagnosis not present

## 2023-06-25 DIAGNOSIS — M5416 Radiculopathy, lumbar region: Secondary | ICD-10-CM | POA: Diagnosis not present

## 2023-06-25 DIAGNOSIS — M25462 Effusion, left knee: Secondary | ICD-10-CM | POA: Diagnosis not present

## 2023-06-25 DIAGNOSIS — M1712 Unilateral primary osteoarthritis, left knee: Secondary | ICD-10-CM | POA: Diagnosis not present

## 2023-06-25 DIAGNOSIS — E6609 Other obesity due to excess calories: Secondary | ICD-10-CM | POA: Diagnosis not present

## 2023-06-25 DIAGNOSIS — M6530 Trigger finger, unspecified finger: Secondary | ICD-10-CM | POA: Diagnosis not present

## 2023-06-25 DIAGNOSIS — M47816 Spondylosis without myelopathy or radiculopathy, lumbar region: Secondary | ICD-10-CM | POA: Diagnosis not present

## 2023-06-25 DIAGNOSIS — Z6830 Body mass index (BMI) 30.0-30.9, adult: Secondary | ICD-10-CM | POA: Diagnosis not present

## 2023-06-25 DIAGNOSIS — E063 Autoimmune thyroiditis: Secondary | ICD-10-CM | POA: Diagnosis not present

## 2023-06-30 ENCOUNTER — Other Ambulatory Visit (HOSPITAL_COMMUNITY): Payer: Self-pay | Admitting: Internal Medicine

## 2023-06-30 DIAGNOSIS — M25462 Effusion, left knee: Secondary | ICD-10-CM | POA: Diagnosis not present

## 2023-06-30 DIAGNOSIS — R52 Pain, unspecified: Secondary | ICD-10-CM

## 2023-06-30 DIAGNOSIS — M1712 Unilateral primary osteoarthritis, left knee: Secondary | ICD-10-CM | POA: Diagnosis not present

## 2023-06-30 DIAGNOSIS — M47816 Spondylosis without myelopathy or radiculopathy, lumbar region: Secondary | ICD-10-CM | POA: Diagnosis not present

## 2023-06-30 DIAGNOSIS — M25561 Pain in right knee: Secondary | ICD-10-CM | POA: Diagnosis not present

## 2023-07-07 ENCOUNTER — Ambulatory Visit (HOSPITAL_COMMUNITY)
Admission: RE | Admit: 2023-07-07 | Discharge: 2023-07-07 | Disposition: A | Discharge status: Home / Self Care | Payer: BC Managed Care – PPO | Source: Ambulatory Visit | Attending: Internal Medicine | Admitting: Internal Medicine

## 2023-07-07 DIAGNOSIS — M25561 Pain in right knee: Secondary | ICD-10-CM | POA: Diagnosis not present

## 2023-07-07 DIAGNOSIS — R52 Pain, unspecified: Secondary | ICD-10-CM | POA: Insufficient documentation

## 2023-07-07 DIAGNOSIS — M25562 Pain in left knee: Secondary | ICD-10-CM | POA: Diagnosis not present

## 2023-08-27 ENCOUNTER — Encounter: Payer: Self-pay | Admitting: Orthopaedic Surgery

## 2023-08-27 ENCOUNTER — Ambulatory Visit (INDEPENDENT_AMBULATORY_CARE_PROVIDER_SITE_OTHER): Payer: BC Managed Care – PPO | Admitting: Orthopaedic Surgery

## 2023-08-27 VITALS — BP 162/89 | HR 71 | Ht 65.0 in | Wt 196.0 lb

## 2023-08-27 DIAGNOSIS — G8929 Other chronic pain: Secondary | ICD-10-CM | POA: Diagnosis not present

## 2023-08-27 DIAGNOSIS — M25562 Pain in left knee: Secondary | ICD-10-CM | POA: Diagnosis not present

## 2023-08-27 MED ORDER — METHYLPREDNISOLONE ACETATE 40 MG/ML IJ SUSP
40.0000 mg | Freq: Once | INTRAMUSCULAR | Status: AC
  Administered 2023-08-27: 40 mg via INTRA_ARTICULAR

## 2023-08-27 NOTE — Progress Notes (Signed)
My left knee is still hurting.  She has had pain in the left knee for several months.  She has swelling and giving way.  She has pain at night medially of the left knee.  She saw Dr. Sherwood Gambler in December and had X-rays of the left knee done 07-07-23 which were negative.  Her knee is giving way more and hurting more.  She has no trauma, no redness, no numbness.  Left knee has medial pain, ROM 0 to 110, slight effusion, slight crepitus, positive medial McMurray, limp left, no distal edema, NV intact.  Encounter Diagnosis  Name Primary?   Chronic pain of left knee Yes   I feel she has a medial meniscus tear.  She will need MRI and possible arthroscopy.  PROCEDURE NOTE:  The patient requests injections of the left knee , verbal consent was obtained.  The left knee was prepped appropriately after time out was performed.   Sterile technique was observed and injection of 1 cc of DepoMedrol 40 mg with several cc's of plain xylocaine. Anesthesia was provided by ethyl chloride and a 20-gauge needle was used to inject the knee area. The injection was tolerated well.  A band aid dressing was applied.  The patient was advised to apply ice later today and tomorrow to the injection sight as needed.  Return in two weeks.  Call if any problem.  Precautions discussed.  Electronically Signed Darreld Mclean, MD 2/12/20258:40 AM

## 2023-08-27 NOTE — Patient Instructions (Signed)
Central scheduling [phone]

## 2023-09-03 ENCOUNTER — Ambulatory Visit (HOSPITAL_COMMUNITY)
Admission: RE | Admit: 2023-09-03 | Discharge: 2023-09-03 | Disposition: A | Discharge status: Home / Self Care | Payer: BC Managed Care – PPO | Source: Ambulatory Visit | Attending: Orthopaedic Surgery | Admitting: Orthopaedic Surgery

## 2023-09-03 DIAGNOSIS — M25462 Effusion, left knee: Secondary | ICD-10-CM | POA: Diagnosis not present

## 2023-09-03 DIAGNOSIS — M25562 Pain in left knee: Secondary | ICD-10-CM | POA: Diagnosis not present

## 2023-09-03 DIAGNOSIS — G8929 Other chronic pain: Secondary | ICD-10-CM | POA: Insufficient documentation

## 2023-09-03 DIAGNOSIS — S83282A Other tear of lateral meniscus, current injury, left knee, initial encounter: Secondary | ICD-10-CM | POA: Diagnosis not present

## 2023-09-03 DIAGNOSIS — S83232A Complex tear of medial meniscus, current injury, left knee, initial encounter: Secondary | ICD-10-CM | POA: Diagnosis not present

## 2023-09-03 DIAGNOSIS — M7122 Synovial cyst of popliteal space [Baker], left knee: Secondary | ICD-10-CM | POA: Diagnosis not present

## 2023-09-17 ENCOUNTER — Ambulatory Visit: Payer: Self-pay | Admitting: Orthopaedic Surgery

## 2023-09-17 ENCOUNTER — Encounter: Payer: Self-pay | Admitting: Orthopaedic Surgery

## 2023-09-17 VITALS — BP 147/85 | HR 71

## 2023-09-17 DIAGNOSIS — M1712 Unilateral primary osteoarthritis, left knee: Secondary | ICD-10-CM

## 2023-09-17 DIAGNOSIS — G8929 Other chronic pain: Secondary | ICD-10-CM

## 2023-09-17 DIAGNOSIS — S83242D Other tear of medial meniscus, current injury, left knee, subsequent encounter: Secondary | ICD-10-CM

## 2023-09-17 NOTE — Progress Notes (Signed)
 My knee is sore  Her left knee continues to hurt, swell and give way.  Her MRI showed: IMPRESSION: 1. Complex tear of the posterior horn medial meniscus with peripheral meniscal extrusion and degeneration of the body. 2. Horizontal tear of the posterior horn lateral meniscus extend to the inferior articular surface. 3. Focal cartilage fissuring of the patellar apex. High-grade partial-thickness cartilage loss of the lateral patellar facet with subchondral reactive marrow edema and moderate partial-thickness cartilage loss of the medial trochlea. High-grade partial-thickness cartilage loss of the trochlear groove. 4. High-grade partial-thickness cartilage loss of the weight-bearing medial femorotibial compartment.   I have explained the findings and showed her a model of the knee.  I will have Dr. Romeo Apple see her for possible arthroscopy of the knee.  I have independently reviewed the MRI.    Left knee has effusion, crepitus, ROM 0 to 110, positive medial McMurray, limp left, NV intact, no distal edema.  Encounter Diagnoses  Name Primary?   Chronic pain of left knee Yes   Other tear of medial meniscus, current injury, left knee, subsequent encounter    To see Dr. Romeo Apple.  Call if any problem.  Precautions discussed.  Electronically Signed Darreld Mclean, MD 3/5/20258:56 AM

## 2023-10-08 ENCOUNTER — Other Ambulatory Visit (INDEPENDENT_AMBULATORY_CARE_PROVIDER_SITE_OTHER)

## 2023-10-08 ENCOUNTER — Encounter: Payer: Self-pay | Admitting: Orthopedic Surgery

## 2023-10-08 ENCOUNTER — Ambulatory Visit (INDEPENDENT_AMBULATORY_CARE_PROVIDER_SITE_OTHER): Admitting: Orthopedic Surgery

## 2023-10-08 VITALS — BP 178/112 | Ht 65.0 in | Wt 190.0 lb

## 2023-10-08 DIAGNOSIS — G8929 Other chronic pain: Secondary | ICD-10-CM

## 2023-10-08 DIAGNOSIS — M1712 Unilateral primary osteoarthritis, left knee: Secondary | ICD-10-CM | POA: Diagnosis not present

## 2023-10-08 NOTE — Progress Notes (Signed)
  Intake history:  BP (!) 178/112 Comment: will see PCP if it stays high  Ht 5\' 5"  (1.651 m)   Wt 190 lb (86.2 kg)   BMI 31.62 kg/m  Body mass index is 31.62 kg/m.    WHAT ARE WE SEEING YOU FOR TODAY?   left knee(s)   How long has this bothered you? (DOI?DOS?WS?)  Several months / since approx Nov/ Dec 2024   Anticoag.  No  Diabetes No  Heart disease No  Hypertension Yes  SMOKING HX No  Kidney disease No  Any ALLERGIES ______________________________________________   Treatment:  Have you taken:  Tylenol No  Advil No  Had PT No  Had injection Yes Dr Hilda Lias injected but has worn off now   Other  _________took ____Naproxen but not currently taking it ____________

## 2023-10-08 NOTE — Progress Notes (Signed)
 [facility] [place]  In office on referral from Dr. Hilda Lias  Reason for referral painful left knee  Chief Complaint  Patient presents with   Knee Pain    Left    This is a 68 year old female who works standing with occasionally having to go up 1 step presents with 4 to 93-month history of knee pain on the left side primarily the medial joint line with some mechanical like symptoms.  She was treated nonoperatively with injection and anti-inflammatories but her symptoms persisted  She eventually had an MRI of the knee which showed severe cartilage loss with meniscal tear as well.  She also had more cartilage loss on the MRI that was seen by plain film  Review of systems she denies chest pain shortness of breath easy bleeding or or bruising and no anesthetic issues  Problem list, medical hx, medications and allergies reviewed    Allergies  Allergen Reactions   Codeine     nausea     BP (!) 178/112 Comment: will see PCP if it stays high  Ht 5\' 5"  (1.651 m)   Wt 190 lb (86.2 kg)   BMI 31.62 kg/m   General Appearance the patient appears to be healthy well-developed normal nourishment nutrition grooming and hygiene  She is oriented x 3.  Her mood is pleasant her affect is normal  She does not limp when she walks  Overall alignment looks to be valgus.  Left knee is tender over the medial joint line.  She has preserved range of motion 0-1 25 with good stability in the joint.  Muscle tone and strength are normal skin is intact with no erythema or rash over the left knee.  Good distal pulses normal temperature no edema or swelling no varicosities are seen.  Sensation in the limb is normal.  Coordination and balance are normal on gait  New plain films were obtained  DG Knee AP/LAT W/[facility] Left Result Date: 10/08/2023 Radiology report left knee Osteoarthritis left knee AP shows valgus alignment to both knees.  The left knee has narrowing in the medial compartment there are no  osteophytes.  Mild peaking of the tibial spines and slight narrowing of the lateral patellofemoral joint space on axial view Impression grade 2/3 OA with joint space reduction    Assessment and plan  My discussion with Ms. Bodi was the following  Arthroscopic surgery will not address her needs at this time.  With this degree of arthritis on MRI arthroscopy will only delay the eventual need for knee replacement surgery  I showed her pictures of a knee replacement explained the risk and benefits of the surgery and she will talk to her son and family about arranging left total knee arthroplasty

## 2023-10-22 ENCOUNTER — Ambulatory Visit (INDEPENDENT_AMBULATORY_CARE_PROVIDER_SITE_OTHER): Admitting: Orthopaedic Surgery

## 2023-10-22 ENCOUNTER — Encounter: Payer: Self-pay | Admitting: Orthopaedic Surgery

## 2023-10-22 DIAGNOSIS — G8929 Other chronic pain: Secondary | ICD-10-CM | POA: Diagnosis not present

## 2023-10-22 DIAGNOSIS — M25562 Pain in left knee: Secondary | ICD-10-CM

## 2023-10-22 MED ORDER — METHYLPREDNISOLONE ACETATE 40 MG/ML IJ SUSP
40.0000 mg | Freq: Once | INTRAMUSCULAR | Status: AC
  Administered 2023-10-22: 40 mg via INTRA_ARTICULAR

## 2023-10-22 NOTE — Progress Notes (Signed)
 PROCEDURE NOTE:  The patient requests injections of the left knee , verbal consent was obtained.  The left knee was prepped appropriately after time out was performed.   Sterile technique was observed and injection of 1 cc of DepoMedrol 40 mg with several cc's of plain xylocaine. Anesthesia was provided by ethyl chloride and a 20-gauge needle was used to inject the knee area. The injection was tolerated well.  A band aid dressing was applied.  The patient was advised to apply ice later today and tomorrow to the injection sight as needed.  Encounter Diagnosis  Name Primary?   Chronic pain of left knee Yes   Return prn.  Call if any problem.  Precautions discussed.  Electronically Signed Darreld Mclean, MD 4/9/20259:44 AM

## 2023-10-22 NOTE — Addendum Note (Signed)
 Addended by: Elvina Mattes T on: 10/22/2023 11:37 AM   Modules accepted: Orders

## 2024-03-05 ENCOUNTER — Encounter: Payer: Self-pay | Admitting: Radiology

## 2024-05-17 ENCOUNTER — Encounter: Payer: Self-pay | Admitting: Radiology
